# Patient Record
Sex: Female | Born: 1955 | Race: White | Hispanic: No | Marital: Married | Smoking: Current every day smoker
Health system: Southern US, Community
[De-identification: ages and names within clinical notes are randomized; demographics above are authoritative.]

## PROBLEM LIST (undated history)

## (undated) DIAGNOSIS — I10 Essential (primary) hypertension: Secondary | ICD-10-CM

## (undated) DIAGNOSIS — E663 Overweight: Secondary | ICD-10-CM

## (undated) DIAGNOSIS — I739 Peripheral vascular disease, unspecified: Secondary | ICD-10-CM

## (undated) DIAGNOSIS — I2510 Atherosclerotic heart disease of native coronary artery without angina pectoris: Secondary | ICD-10-CM

## (undated) DIAGNOSIS — I6529 Occlusion and stenosis of unspecified carotid artery: Secondary | ICD-10-CM

## (undated) DIAGNOSIS — I219 Acute myocardial infarction, unspecified: Secondary | ICD-10-CM

## (undated) DIAGNOSIS — E079 Disorder of thyroid, unspecified: Secondary | ICD-10-CM

## (undated) DIAGNOSIS — D649 Anemia, unspecified: Secondary | ICD-10-CM

## (undated) DIAGNOSIS — Z720 Tobacco use: Secondary | ICD-10-CM

## (undated) HISTORY — DX: Essential (primary) hypertension: I10

## (undated) HISTORY — DX: Tobacco use: Z72.0

## (undated) HISTORY — PX: APPENDECTOMY: SHX54

## (undated) HISTORY — PX: ABDOMINAL HYSTERECTOMY: SHX81

## (undated) HISTORY — DX: Occlusion and stenosis of unspecified carotid artery: I65.29

## (undated) HISTORY — PX: BACK SURGERY: SHX140

## (undated) HISTORY — PX: CORONARY ANGIOPLASTY WITH STENT PLACEMENT: SHX49

## (undated) HISTORY — DX: Overweight: E66.3

## (undated) HISTORY — DX: Anemia, unspecified: D64.9

## (undated) HISTORY — DX: Peripheral vascular disease, unspecified: I73.9

## (undated) HISTORY — PX: PLANTAR FASCIA SURGERY: SHX746

## (undated) HISTORY — DX: Disorder of thyroid, unspecified: E07.9

---

## 1999-01-16 ENCOUNTER — Encounter: Payer: Self-pay | Admitting: Family Medicine

## 1999-01-16 ENCOUNTER — Encounter: Admission: RE | Admit: 1999-01-16 | Discharge: 1999-01-16 | Payer: Self-pay | Admitting: Family Medicine

## 1999-01-23 ENCOUNTER — Encounter: Admission: RE | Admit: 1999-01-23 | Discharge: 1999-01-23 | Payer: Self-pay | Admitting: Family Medicine

## 1999-01-23 ENCOUNTER — Encounter: Payer: Self-pay | Admitting: Family Medicine

## 1999-03-25 ENCOUNTER — Ambulatory Visit: Admission: RE | Admit: 1999-03-25 | Discharge: 1999-03-25 | Payer: Self-pay | Admitting: Family Medicine

## 1999-11-19 ENCOUNTER — Other Ambulatory Visit: Admission: RE | Admit: 1999-11-19 | Discharge: 1999-11-19 | Payer: Self-pay | Admitting: Obstetrics and Gynecology

## 1999-11-20 ENCOUNTER — Other Ambulatory Visit: Admission: RE | Admit: 1999-11-20 | Discharge: 1999-11-20 | Payer: Self-pay | Admitting: Obstetrics and Gynecology

## 1999-11-20 ENCOUNTER — Encounter (INDEPENDENT_AMBULATORY_CARE_PROVIDER_SITE_OTHER): Payer: Self-pay

## 2000-01-25 ENCOUNTER — Encounter: Payer: Self-pay | Admitting: Family Medicine

## 2000-01-25 ENCOUNTER — Encounter: Admission: RE | Admit: 2000-01-25 | Discharge: 2000-01-25 | Payer: Self-pay | Admitting: Family Medicine

## 2000-11-18 ENCOUNTER — Other Ambulatory Visit: Admission: RE | Admit: 2000-11-18 | Discharge: 2000-11-18 | Payer: Self-pay | Admitting: Obstetrics and Gynecology

## 2002-01-17 ENCOUNTER — Other Ambulatory Visit: Admission: RE | Admit: 2002-01-17 | Discharge: 2002-01-17 | Payer: Self-pay | Admitting: Obstetrics and Gynecology

## 2002-02-13 ENCOUNTER — Encounter: Admission: RE | Admit: 2002-02-13 | Discharge: 2002-02-13 | Payer: Self-pay | Admitting: Obstetrics and Gynecology

## 2002-02-13 ENCOUNTER — Encounter: Payer: Self-pay | Admitting: Obstetrics and Gynecology

## 2002-02-20 ENCOUNTER — Encounter: Admission: RE | Admit: 2002-02-20 | Discharge: 2002-02-20 | Payer: Self-pay | Admitting: Internal Medicine

## 2002-02-20 ENCOUNTER — Encounter: Payer: Self-pay | Admitting: Obstetrics and Gynecology

## 2002-04-17 ENCOUNTER — Encounter (INDEPENDENT_AMBULATORY_CARE_PROVIDER_SITE_OTHER): Payer: Self-pay | Admitting: Specialist

## 2002-04-17 ENCOUNTER — Observation Stay (HOSPITAL_COMMUNITY): Admission: RE | Admit: 2002-04-17 | Discharge: 2002-04-18 | Payer: Self-pay | Admitting: Obstetrics and Gynecology

## 2003-07-29 ENCOUNTER — Encounter: Admission: RE | Admit: 2003-07-29 | Discharge: 2003-07-29 | Payer: Self-pay | Admitting: Family Medicine

## 2003-08-18 ENCOUNTER — Encounter: Admission: RE | Admit: 2003-08-18 | Discharge: 2003-08-18 | Payer: Self-pay | Admitting: Family Medicine

## 2003-09-12 ENCOUNTER — Inpatient Hospital Stay (HOSPITAL_COMMUNITY): Admission: RE | Admit: 2003-09-12 | Discharge: 2003-09-14 | Payer: Self-pay | Admitting: Neurosurgery

## 2004-04-06 ENCOUNTER — Encounter: Admission: RE | Admit: 2004-04-06 | Discharge: 2004-04-06 | Payer: Self-pay | Admitting: Family Medicine

## 2004-12-10 ENCOUNTER — Inpatient Hospital Stay (HOSPITAL_COMMUNITY): Admission: RE | Admit: 2004-12-10 | Discharge: 2004-12-13 | Payer: Self-pay | Admitting: Orthopaedic Surgery

## 2009-04-10 ENCOUNTER — Inpatient Hospital Stay (HOSPITAL_COMMUNITY): Admission: EM | Admit: 2009-04-10 | Discharge: 2009-04-12 | Payer: Self-pay | Admitting: Cardiology

## 2009-04-10 ENCOUNTER — Encounter: Payer: Self-pay | Admitting: Emergency Medicine

## 2009-04-10 ENCOUNTER — Ambulatory Visit: Payer: Self-pay | Admitting: Cardiology

## 2009-04-10 ENCOUNTER — Encounter: Payer: Self-pay | Admitting: Cardiology

## 2009-04-18 ENCOUNTER — Encounter: Payer: Self-pay | Admitting: Cardiology

## 2009-04-23 ENCOUNTER — Encounter: Payer: Self-pay | Admitting: Cardiology

## 2009-04-23 DIAGNOSIS — E039 Hypothyroidism, unspecified: Secondary | ICD-10-CM | POA: Insufficient documentation

## 2009-04-23 DIAGNOSIS — D649 Anemia, unspecified: Secondary | ICD-10-CM

## 2009-04-23 DIAGNOSIS — F17200 Nicotine dependence, unspecified, uncomplicated: Secondary | ICD-10-CM | POA: Insufficient documentation

## 2009-04-23 DIAGNOSIS — I10 Essential (primary) hypertension: Secondary | ICD-10-CM | POA: Insufficient documentation

## 2009-04-24 ENCOUNTER — Ambulatory Visit: Payer: Self-pay | Admitting: Cardiology

## 2009-04-24 ENCOUNTER — Encounter: Payer: Self-pay | Admitting: Physician Assistant

## 2009-04-24 DIAGNOSIS — I2510 Atherosclerotic heart disease of native coronary artery without angina pectoris: Secondary | ICD-10-CM | POA: Insufficient documentation

## 2009-04-24 DIAGNOSIS — R079 Chest pain, unspecified: Secondary | ICD-10-CM

## 2009-04-24 DIAGNOSIS — E876 Hypokalemia: Secondary | ICD-10-CM

## 2009-04-24 DIAGNOSIS — E785 Hyperlipidemia, unspecified: Secondary | ICD-10-CM | POA: Insufficient documentation

## 2009-04-24 LAB — CONVERTED CEMR LAB
BUN: 18 mg/dL (ref 6–23)
Sodium: 139 meq/L (ref 135–145)

## 2009-06-09 ENCOUNTER — Telehealth (INDEPENDENT_AMBULATORY_CARE_PROVIDER_SITE_OTHER): Payer: Self-pay | Admitting: *Deleted

## 2009-06-18 ENCOUNTER — Ambulatory Visit: Payer: Self-pay

## 2009-06-18 ENCOUNTER — Ambulatory Visit: Payer: Self-pay | Admitting: Cardiology

## 2009-06-18 DIAGNOSIS — I739 Peripheral vascular disease, unspecified: Secondary | ICD-10-CM

## 2009-06-18 DIAGNOSIS — R5383 Other fatigue: Secondary | ICD-10-CM

## 2009-06-18 DIAGNOSIS — R5381 Other malaise: Secondary | ICD-10-CM | POA: Insufficient documentation

## 2009-06-20 ENCOUNTER — Telehealth: Payer: Self-pay | Admitting: Cardiology

## 2009-06-20 ENCOUNTER — Encounter: Payer: Self-pay | Admitting: Cardiology

## 2009-06-20 LAB — CONVERTED CEMR LAB
AST: 25 units/L (ref 0–37)
Basophils Absolute: 0 10*3/uL (ref 0.0–0.1)
Basophils Relative: 0.3 % (ref 0.0–3.0)
Bilirubin, Direct: 0 mg/dL (ref 0.0–0.3)
CO2: 30 meq/L (ref 19–32)
Calcium: 9 mg/dL (ref 8.4–10.5)
Cholesterol: 118 mg/dL (ref 0–200)
Eosinophils Relative: 2 % (ref 0.0–5.0)
HDL: 53 mg/dL (ref >39.00)
Lymphs Abs: 2.2 10*3/uL (ref 0.7–4.0)
Monocytes Relative: 5.6 % (ref 3.0–12.0)
Neutro Abs: 5.1 10*3/uL (ref 1.4–7.7)
Neutrophils Relative %: 64.3 % (ref 43.0–77.0)
Platelets: 192 10*3/uL (ref 150.0–400.0)
Potassium: 4.8 meq/L (ref 3.5–5.1)
RBC: 4.46 M/uL (ref 3.87–5.11)
Total CHOL/HDL Ratio: 2
Total Protein: 7.6 g/dL (ref 6.0–8.3)

## 2009-07-01 ENCOUNTER — Ambulatory Visit: Payer: Self-pay | Admitting: Cardiovascular Disease

## 2009-07-02 ENCOUNTER — Ambulatory Visit (HOSPITAL_COMMUNITY): Admission: RE | Admit: 2009-07-02 | Discharge: 2009-07-02 | Payer: Self-pay | Admitting: Cardiovascular Disease

## 2009-07-02 ENCOUNTER — Ambulatory Visit: Payer: Self-pay | Admitting: Cardiovascular Disease

## 2009-07-02 LAB — CONVERTED CEMR LAB
BUN: 12 mg/dL (ref 6–23)
Basophils Relative: 0 % (ref 0–1)
CO2: 27 meq/L (ref 19–32)
Calcium: 9 mg/dL (ref 8.4–10.5)
Eosinophils Absolute: 0.2 10*3/uL (ref 0.0–0.7)
Eosinophils Relative: 3 % (ref 0–5)
Glucose, Bld: 87 mg/dL (ref 70–99)
HCT: 35.7 % — ABNORMAL LOW (ref 36.0–46.0)
INR: 1.08 (ref <1.50)
Lymphs Abs: 2.6 10*3/uL (ref 0.7–4.0)
MCV: 87.7 fL (ref 78.0–100.0)
Monocytes Absolute: 0.5 10*3/uL (ref 0.1–1.0)
Neutro Abs: 3.1 10*3/uL (ref 1.7–7.7)
Neutrophils Relative %: 48 % (ref 43–77)
Platelets: 160 10*3/uL (ref 150–400)
Prothrombin Time: 13.9 s (ref 11.6–15.2)
RDW: 14.4 % (ref 11.5–15.5)
Sodium: 136 meq/L (ref 135–145)
aPTT: 30 s (ref 24–37)

## 2009-07-14 ENCOUNTER — Encounter: Payer: Self-pay | Admitting: Cardiovascular Disease

## 2009-07-15 ENCOUNTER — Ambulatory Visit: Payer: Self-pay | Admitting: Cardiovascular Disease

## 2009-07-15 ENCOUNTER — Ambulatory Visit: Payer: Self-pay

## 2009-08-07 ENCOUNTER — Ambulatory Visit: Payer: Self-pay | Admitting: Cardiology

## 2009-11-27 ENCOUNTER — Encounter: Payer: Self-pay | Admitting: Cardiology

## 2009-12-02 ENCOUNTER — Ambulatory Visit: Payer: Self-pay | Admitting: Cardiology

## 2010-01-16 ENCOUNTER — Telehealth: Payer: Self-pay | Admitting: Cardiology

## 2010-03-10 ENCOUNTER — Telehealth: Payer: Self-pay | Admitting: Cardiology

## 2010-03-11 ENCOUNTER — Encounter (INDEPENDENT_AMBULATORY_CARE_PROVIDER_SITE_OTHER): Payer: Self-pay | Admitting: *Deleted

## 2010-04-14 NOTE — Progress Notes (Signed)
Summary: re taking pain med for back pain  Phone Note Call from Patient   Caller: Patient [phone] Reason for Call: Talk to Nurse Summary of Call: can pt take alleve, advil or predisone for back pain? her orthopedic wants an ok before having her take any of them due to her plavix Initial call taken by: Glynda Jaeger,  January 16, 2010 1:24 PM  Follow-up for Phone Call        I called and spoke with the pt. I made her aware we do not recommend aleve and advil due to the increased risk of bleeding. I will review with Dr. Juanda Chance if he feels prednisone would be appropriate for her. Follow-up by: Sherri Rad, RN, BSN,  January 16, 2010 3:00 PM  Additional Follow-up for Phone Call Additional follow up Details #1::        The above was reviewed with Dr. Juanda Chance. He feels like the pt could tolerate aleve. He would recommend this above the other medications. If the pt required prednisone, it would be ok, but there are more associated SE with prednisone. I have called and explained this to the pt. Additional Follow-up by: Sherri Rad, RN, BSN,  January 16, 2010 6:09 PM

## 2010-04-14 NOTE — Miscellaneous (Signed)
Summary: [facility] Order/Treatment Plan  [facility] Order/Treatment Plan   Imported By: Roderic Ovens 04/29/2009 12:10:12  _____________________________________________________________________  External Attachment:    Type:   Image     Comment:   External Document

## 2010-04-14 NOTE — Assessment & Plan Note (Signed)
Summary: npv   Visit Type:  Initial Consult Primary Provider:  Dr Drue Novel in [place]  CC:  right leg pain.  History of Present Illness: 55 yo WF with history of CAD s/p inferior MI with bare metal stent in the RCA, HTN, hyperlipidemia, hypothyroidism, ongoing tobacco abuse who is here today for evaluation of right leg pain. She describes pain in the right hip, buttock and thigh after walking 300 feet. This resolves with rest. She also has severe pain in her right calf and foot at night extending up to her knee. This is described as a severe stabbing  pain. It lasts for 15 minutes and resolves on its own.   Current Medications (verified): 1)  Aspirin 81 Mg Tbec (Aspirin) .... Take One Tablet By Mouth Daily 2)  Plavix 75 Mg Tabs (Clopidogrel Bisulfate) .... Take One Daily 3)  Nitrostat 0.4 Mg Subl (Nitroglycerin) .... Take As Needed 4)  Crestor 40 Mg Tabs (Rosuvastatin Calcium) .... Take One Daily 5)  Alprazolam 0.5 Mg Tabs (Alprazolam) .... Take One Two Times A Day 6)  Atenolol 50 Mg Tabs (Atenolol) .... Take One Two Times A Day 7)  Levothroid 75 Mcg Tabs (Levothyroxine Sodium) .... Take One Daily 8)  Lisinopril 20 Mg Tabs (Lisinopril) .... Take One Daily  Allergies: 1)  ! Codeine 2)  ! Prednisone  Past History:  Past Medical History:  1. Hypertension.   2. Unclear lipid status.   3. Family history of premature coronary artery disease.   4. Ongoing tobacco use.   5. Overweight.   6. Hypothyroidism.   7. History of anemia.   8. PVD 9. CAD with inferior STEMI      Past Surgical History: Reviewed history from 04/23/2009 and no changes required.  She is status post back surgery as well as surgery   for plantar fasciitis, hysterectomy, and appendectomy.      Family History: Reviewed history from 04/23/2009 and no changes required.  Her mother-deceased,  had a history of diabetes and hypertension, died of CHF.   Her father is deceased,  had a history of  coronary artery disease.  She also has one sister that had an MI in her 89s.   Social History: Reviewed history from 04/23/2009 and no changes required.   She lives in [place] with her husband. She is married. She has 3 children.  She has approximately 30-pack-year history of ongoing tobacco use.  She currently is smoking 6-7 cigarettes per day.  She denies alcohol or drug abuse.  She does not exercise.  She works at a [employer].   Review of Systems  The patient denies fatigue, malaise, fever, weight gain/loss, vision loss, decreased hearing, hoarseness, chest pain, palpitations, shortness of breath, prolonged cough, wheezing, sleep apnea, coughing up blood, abdominal pain, blood in stool, nausea, vomiting, diarrhea, heartburn, incontinence, blood in urine, muscle weakness, joint pain, leg swelling, rash, skin lesions, headache, fainting, dizziness, depression, anxiety, enlarged lymph nodes, easy bruising or bleeding, and environmental allergies.         See HPI  Vital Signs:  Patient profile:   55 year old female Height:      64 inches Weight:      162 pounds BMI:     27.91 Pulse rate:   60 / minute Pulse rhythm:   irregular BP sitting:   130 / 70  (left arm) Cuff size:   large  Vitals Entered By: Danielle Rankin, CMA (July 01, 2009 3:49 PM)  Physical  Exam  General:  General: Well developed, well nourished, NAD HEENT: OP clear, mucus membranes moist SKIN: warm, dry Neuro: No focal deficits Musculoskeletal: Muscle strength 5/5 all ext Psychiatric: Mood and affect normal Neck: No JVD, no carotid bruits, no thyromegaly, no lymphadenopathy. Lungs:Clear bilaterally, no wheezes, rhonci, crackles CV: RRR no murmurs, gallops rubs Abdomen: soft, NT, ND, BS present Extremities: No edema, pulses 1+ left DP/PT. Trace right DP/PT.     EKG  Procedure date:  07/01/2009  Findings:      NSR, rate 60 bpm. Nonspecific T wave changes.   Arterial Doppler  Procedure date:   06/18/2009  Findings:      Severe ostial stenosis right common iliac artery. ABI 0.80 on right. 1.2 on left. Right ATA and PTA monophasic. Left ATA and PTA triphasic.   Impression & Recommendations:  Problem # 1:  PVD (ICD-443.9) She has symptoms c/w obstructive disease in right iliac system. This is confirmed by dopplers. Will arrange distal aortogram with bilateral lower ext runoff tomorrow with possible PTA of right iliac. BMET, CBC and coags today. Risk and benefits explained.   Other Orders: EKG w/ Interpretation (93000) PV Procedure (PV Procedure) T-Basic Metabolic Panel (56213-08657) T-CBC w/Diff ([id number]) T-PTT (41324-40102) T-Protime, Auto ([id number])  Patient Instructions: 1)  Your physician recommends that you schedule a follow-up appointment in: 2 weeks 2)  Your physician recommends that you continue on your current medications as directed. Please refer to the Current Medication list given to you today. 3)  Your physician has requested that you have a peripheral vascular angiogram. This exam is performed at the hospital. During this exam IV contrast is used to look at arterial blood flow.  Please review the information sheet given for details.

## 2010-04-14 NOTE — Assessment & Plan Note (Signed)
Summary: 2wk f/u cath 07-02-09/sl   Visit Type:  2 wk f/u Primary Provider:  Dr Drue Novel in [place]  CC:  mild swelling legs .  History of Present Illness: 55 yo WF with history of PVD, CAD s/p inferior MI with bare metal stent in the RCA, HTN, hyperlipidemia, hypothyroidism, ongoing tobacco abuse who is here today for PV follow up. She was seen as a new patient 2 weeks ago and described pain in the right hip, buttock and thigh after walking 300 feet. This resolved with rest. She also had severe pain in her right calf and foot at night extending up to her knee. This is described as a severe stabbing  pain. Non-invasive studies showed severe ostial right common iliac artery stenosis. I arranged a distal aortogram with bilateral lower extremity runoff for the following day. Angiography demonstrated ostial RCIA stenosis. A stent was placed in the right common iliac artery. She has done well since the procedure. Her right leg pain has completely resolved. She notes mild lower ext edema, bilateral, resolves at night. She has cut back to 5 cigarettes per day.   Problems Prior to Update: 1)  Fatigue  (ICD-780.79) 2)  Pvd  (ICD-443.9) 3)  Pvd  (ICD-443.9) 4)  Hypokalemia  (ICD-276.8) 5)  Hyperlipidemia-mixed  (ICD-272.4) 6)  Cad, Native Vessel  (ICD-414.01) 7)  Chest Pain Unspecified  (ICD-786.50) 8)  Anemia  (ICD-285.9) 9)  Hypothyroidism  (ICD-244.9) 10)  Tobacco Abuse  (ICD-305.1) 11)  Hypertension  (ICD-401.9)  Current Medications (verified): 1)  Aspirin 81 Mg Tbec (Aspirin) .... Take One Tablet By Mouth Daily 2)  Plavix 75 Mg Tabs (Clopidogrel Bisulfate) .... Take One Daily 3)  Nitrostat 0.4 Mg Subl (Nitroglycerin) .... Take As Needed 4)  Crestor 40 Mg Tabs (Rosuvastatin Calcium) .... Take One Daily 5)  Alprazolam 0.5 Mg Tabs (Alprazolam) .... Take One Two Times A Day 6)  Atenolol 50 Mg Tabs (Atenolol) .... Take One Two Times A Day 7)  Levothroid 75 Mcg Tabs (Levothyroxine  Sodium) .... Take One Daily 8)  Lisinopril 20 Mg Tabs (Lisinopril) .... Take One Daily  Allergies: 1)  ! Codeine 2)  ! Prednisone  Past History:  Past Medical History:  1. Hypertension.   2. Unclear lipid status.   3. Family history of premature coronary artery disease.   4. Ongoing tobacco use.   5. Overweight.   6. Hypothyroidism.   7. History of anemia.   8. PVD-s/p stent ostium right common iliac artery 9. CAD with inferior STEMI      Social History: Reviewed history from 07/01/2009 and no changes required.   She lives in [place] with her husband. She is married. She has 3 children.  She has approximately 30-pack-year history of ongoing tobacco use.  She currently is smoking 5 cigarettes per day.  She denies alcohol or drug abuse.  She does not exercise.  She works at a [employer].   Review of Systems       The patient complains of leg swelling.  The patient denies fatigue, malaise, fever, weight gain/loss, vision loss, decreased hearing, hoarseness, chest pain, palpitations, shortness of breath, prolonged cough, wheezing, sleep apnea, coughing up blood, abdominal pain, blood in stool, nausea, vomiting, diarrhea, heartburn, incontinence, blood in urine, muscle weakness, joint pain, rash, skin lesions, headache, fainting, dizziness, depression, anxiety, enlarged lymph nodes, easy bruising or bleeding, and environmental allergies.    Vital Signs:  Patient profile:   55 year old female Height:  64 inches Weight:      164 pounds Pulse rate:   63 / minute BP sitting:   112 / 76  (left arm) Cuff size:   regular  Vitals Entered By: Oswald Hillock (Jul 15, 2009 5:02 PM)  Physical Exam  General:  General: Well developed, well nourished, NAD Musculoskeletal: Muscle strength 5/5 all ext Psychiatric: Mood and affect normal Neck: No JVD, no carotid bruits, no thyromegaly, no lymphadenopathy. Lungs:Clear bilaterally, no wheezes, rhonci, crackles CV: RRR no  murmurs, gallops rubs Abdomen: soft, NT, ND, BS present Extremities: No edema on exam today, pulses 1-2+ bilateral DP/PT    ABI's  Procedure date:  07/15/2009  Findings:      ABI 1.1 right and left.   Arteriogram-Lower Extremity  Procedure date:  07/02/2009  Findings:      ANGIOGRAPHIC FINDINGS: 1. The distal aorta has mild plaque disease. 2. The bilateral renal arteries are patent without significant     stenosis. 3. The right common iliac artery has a 95% stenosis at the ostium.     The right external iliac artery and right common femoral artery     have plaque disease.  The right superficial femoral artery and     popliteal artery are patent without any significant stenosis.     There is 3-vessel runoff to the right foot via the anterior tibial     artery to posterior tibial artery and peroneal artery. 4. The left common iliac artery, external iliac artery, internal iliac     artery and common femoral artery are patent without any disease.     The left superficial femoral artery and popliteal artery are patent     without any disease.  There is 3-vessel runoff to the left foot.   IMPRESSION: 1. 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 2. Successful percutaneous transluminal angioplasty with placement of     a stent in the ostium of the right common iliac artery.  Impression & Recommendations:  Problem # 1:  PVD (ICD-443.9) s/p stent ostium of right common iliac artery. Doing well. ABI normalized. Pain resolved. She is on ASA and Plavix post MI. Will continue. Repeat ABI in 6 months. I will see her back in one year.   Problem # 2:  TOBACCO ABUSE (ICD-305.1) Smoking cessation encouraged. She is trying to stop.   Patient Instructions: 1)  Your physician recommends that you schedule a follow-up appointment in: 1 year 2)  Your physician has requested that you have an ankle brachial index (ABI). During this test an ultrasound and blood pressure cuff are used to  evaluate the arteries that supply the arms and legs with blood. Allow thirty minutes for this exam. There are no restrictions or special instructions. To be done in 6 months

## 2010-04-14 NOTE — Miscellaneous (Signed)
Summary: Orders Update  Clinical Lists Changes  Orders: Added new Test order of Arterial Duplex Lower Extremity (Arterial Duplex Low) - Signed 

## 2010-04-14 NOTE — Assessment & Plan Note (Signed)
Summary: Lisa [name]   Primary Provider:  Dr Drue Novel in [place]  CC:  chest pain pt takes nitro which helps.  History of Present Illness: The patient is 55 years and then returned for management of CAD. In June 2007 she had an inferior MI and with a bare-metal stent to the RCA in April 2011 she had stenting of the right iliac artery by Dr. Gildardo Griffes.  She had a negative stress ECG 5/11  She had had no cp, sob, palp.  She still smokes..    Current Medications (verified): 1)  Aspirin 81 Mg Tbec (Aspirin) .... Take One Tablet By Mouth Daily 2)  Plavix 75 Mg Tabs (Clopidogrel Bisulfate) .... Take One Daily 3)  Nitrostat 0.4 Mg Subl (Nitroglycerin) .... Take As Needed 4)  Crestor 40 Mg Tabs (Rosuvastatin Calcium) .... Take One Daily 5)  Alprazolam 0.5 Mg Tabs (Alprazolam) .... Take One Two Times A Day 6)  Atenolol 50 Mg Tabs (Atenolol) .... Take One Two Times A Day 7)  Levothroid 75 Mcg Tabs (Levothyroxine Sodium) .... Take One Daily 8)  Lisinopril 20 Mg Tabs (Lisinopril) .... Take One Daily  Allergies: 1)  ! Codeine 2)  ! Prednisone  Past History:  Past Surgical History: Last updated: 04/23/2009  She is status post back surgery as well as surgery   for plantar fasciitis, hysterectomy, and appendectomy.      Family History: Last updated: Jul 18, 2009  Her mother-deceased,  had a history of diabetes and hypertension, died of CHF.   Her father is deceased,  had a history of coronary artery disease.  She also has one sister that had an MI in her 50s.   Social History: Last updated: 07/15/2009   She lives in [place] with her husband. She is married. She has 3 children.  She has approximately 30-pack-year history of ongoing tobacco use.  She currently is smoking 5 cigarettes per day.  She denies alcohol or drug abuse.  She does not exercise.  She works at a [employer].   Past Medical History: Reviewed history from 07/15/2009 and no changes required.  1. Hypertension.    2. Unclear lipid status.   3. Family history of premature coronary artery disease.   4. Ongoing tobacco use.   5. Overweight.   6. Hypothyroidism.   7. History of anemia.   8. PVD-s/p stent ostium right common iliac artery 9. CAD with inferior STEMI      Review of Systems       ROS is negative except as outlined in HPI.   Vital Signs:  Patient profile:   55 year old female Height:      64 inches Weight:      156 pounds BMI:     26.87 Pulse rate:   58 / minute Resp:     14 per minute BP sitting:   130 / 75  (left arm)  Vitals Entered By: Kem Parkinson (December 02, 2009 3:58 PM)  Physical Exam  Additional Exam:  Gen. Well-nourished, in no distress   Neck: No JVD, thyroid not enlarged, no carotid bruits Lungs: No tachypnea, clear without rales, rhonchi or wheezes Cardiovascular: Rhythm regular, PMI not displaced,  heart sounds  normal, no murmurs or gallops, no peripheral edema, pulses normal in all 4 extremities. Abdomen: BS normal, abdomen soft and non-tender without masses or organomegaly, no hepatosplenomegaly. MS: No deformities, no cyanosis or clubbing   Neuro:  No focal sns   Skin:  no lesions  Impression & Recommendations:  Problem # 1:  CAD, NATIVE VESSEL (ICD-414.01) She had DMI Rx BMS. No cp.  This is stable. Her updated medication list for this problem includes:    Aspirin 81 Mg Tbec (Aspirin) .Marland Kitchen... Take one tablet by mouth daily    Plavix 75 Mg Tabs (Clopidogrel bisulfate) .Marland Kitchen... Take one daily    Nitrostat 0.4 Mg Subl (Nitroglycerin) .Marland Kitchen... Take as needed    Atenolol 50 Mg Tabs (Atenolol) .Marland Kitchen... Take one two times a day    Lisinopril 20 Mg Tabs (Lisinopril) .Marland Kitchen... Take one daily  Orders: EKG w/ Interpretation (93000)  Problem # 2:  PVD (ICD-443.9) She had stent iliac R.  No claudication.  Stable.  Problem # 3:  TOBACCO ABUSE (ICD-305.1) She continues to smoke.  We counseled.  Patient Instructions: 1)  Your physician recommends that you  continue on your current medications as directed. Please refer to the Current Medication list given to you today. 2)  Your physician wants you to follow-up in: 6 months with Dr. Clifton James.  You will receive a reminder letter in the mail two months in advance. If you don't receive a letter, please call our office to schedule the follow-up appointment.

## 2010-04-14 NOTE — Assessment & Plan Note (Signed)
Summary: 2 MONTH ROV   Visit Type:  Follow-up Primary Provider:  Dr Drue Novel in [place]  CC:  pt had eposide of chest pain- pt took nitro pain went away.  History of Present Illness: Patient is 55 years old and return for management of CAD. 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 On April 10, 2009 she had an inferior MI treated with a bare-metal stent to the right coronary artery. Her ejection fraction was 65%. She has only fair since that time. She has had symptoms of fatigue and has not had any energy. She says she can't afford to be involved in the rehabilitation program. She also has had calf pain in the right calf the night before last which was sharp and lasted about a half an hour. She's had a similar episode about a week before her heart attack. She was concerned that this might be peripheral vascular disease.  Her other problems include hypertension and hyperlipidemia. She is also a cigarette smoker but has cut back from one half packs for 30 cigarettes a day to 5 cigarettes a day.  Allergies (verified): 1)  ! Codeine 2)  ! Prednisone  Past History:  Past Medical History: Reviewed history from 04/23/2009 and no changes required.  1. Hypertension.   2. Unclear lipid status.   3. Family history of premature coronary artery disease.   4. Ongoing tobacco use.   5. Overweight.   6. Hypothyroidism.   7. History of anemia.      Review of Systems       ROS is negative except as outlined in HPI.   Vital Signs:  Patient profile:   55 year old female Height:      64 inches Weight:      162 pounds BMI:     27.91 Pulse rate:   51 / minute BP sitting:   127 / 71  (left arm) Cuff size:   regular  Vitals Entered By: Burnett Kanaris, CNA (June 18, 2009 9:10 AM)  Physical Exam  Additional Exam:  Gen. Well-nourished, in no distress   Neck: No JVD, thyroid not enlarged, no carotid bruits Lungs: No tachypnea, clear without rales, rhonchi or  wheezes Cardiovascular: Rhythm regular, PMI not displaced,  heart sounds  normal, no murmurs or gallops, no peripheral edema, I had difficulty feeling pulses in either foot and her femoral pulses were decreased. Abdomen: BS normal, abdomen soft and non-tender without masses or organomegaly, no hepatosplenomegaly. MS: No deformities, no cyanosis or clubbing   Neuro:  No focal sns   Skin:  no lesions    Impression & Recommendations:  Problem # 1:  CAD, NATIVE VESSEL (ICD-414.01)  She had an inferior MI in January treated with a bare-metal stent to the right coronary. She has had some recurrent chest pain and she also has had continued fatigue. Her ejection fraction was 65% catheterization. Her ECG today shows T-wave inversion in V1 and V2 which is a little different than her previous ECG. Not sure regarding etiology of her chest pain and fatigue. We will get blood studies today to evaluate the fatigue and we'll arrange for her to have a stress ECG in about 6 weeks. She can't walk up a hill because of her back so we'll have her walk on flat protocol Her updated medication list for this problem includes:    Aspirin 81 Mg Tbec (Aspirin) .Marland Kitchen... Take one tablet by mouth daily    Plavix 75 Mg Tabs (Clopidogrel  bisulfate) .Marland Kitchen... Take one daily    Nitrostat 0.4 Mg Subl (Nitroglycerin) .Marland Kitchen... Take as needed    Atenolol 50 Mg Tabs (Atenolol) .Marland Kitchen... Take one two times a day    Lisinopril 20 Mg Tabs (Lisinopril) .Marland Kitchen... Take one daily  Her updated medication list for this problem includes:    Aspirin 81 Mg Tbec (Aspirin) .Marland Kitchen... Take one tablet by mouth daily    Plavix 75 Mg Tabs (Clopidogrel bisulfate) .Marland Kitchen... Take one daily    Nitrostat 0.4 Mg Subl (Nitroglycerin) .Marland Kitchen... Take as needed    Atenolol 50 Mg Tabs (Atenolol) .Marland Kitchen... Take one two times a day    Lisinopril 20 Mg Tabs (Lisinopril) .Marland Kitchen... Take one daily  Orders: EKG w/ Interpretation (93000) TLB-BMP (Basic Metabolic Panel-BMET) (80048-METABOL) TLB-CBC  Platelet - w/Differential (85025-CBCD) TLB-Hepatic/Liver Function Pnl (80076-HEPATIC) TLB-Lipid Panel (80061-LIPID) TLB-TSH (Thyroid Stimulating Hormone) (84443-TSH) Treadmill (Treadmill)  Problem # 2:  PVD (ICD-443.9) She has developed pain in her right calf which occurs at rest and doesn't sound like it's related to peripheral vascular disease. However her pulses are decreased in her feet and her femoral region I suspect she does have significant peripheral vascular disease. We will evaluate her with peripheral arterial Doppler studies. I suspect the pain in his leg is related to some type of neuropathy. She has no edema.  Problem # 3:  HYPERLIPIDEMIA-MIXED (ICD-272.4)  We will get a fasting lipid and liver profile today. Her updated medication list for this problem includes:    Crestor 40 Mg Tabs (Rosuvastatin calcium) .Marland Kitchen... Take one daily  Her updated medication list for this problem includes:    Crestor 40 Mg Tabs (Rosuvastatin calcium) .Marland Kitchen... Take one daily  Other Orders: Arterial Duplex Lower Extremity (Arterial Duplex Low)  Patient Instructions: 1)  Your physician recommends that you have FASTING lab work today: lipid/liver/cbc/bmet/tsh (414.01;272.2;402.10) 2)  Your physician has requested that you have a lower extremity arterial duplex.  This test is an ultrasound of the arteries in the legs or arms.  It looks at arterial blood flow in the legs and arms.  Allow one hour for Lower and Upper Arterial scans. There are no restrictions or special instructions. 3)  Your physician has requested that you have an exercise tolerance test in 6 weeks.  For further information please visit [email or web address]  Please also follow instruction sheet, as given. 4)  Your physician has recommended you make the following change in your medication: 1) Decrease aspirin to 81mg  once daily.

## 2010-04-14 NOTE — Progress Notes (Signed)
  Walk in Patient Form Recieved "Pt left papers " Credit Disability Insurance Claim" forwarded to [facility] for processing [facility]  June 09, 2009 4:14 PM    Appended Document:  Pt is here today for Appt w/ Juanda Chance she was checking up on her "[employer]" form, Renee from [employer] said the paperwork is Pending..Let pt/Lisa [name] know.Marland Kitchenkm  Appended Document:  Recieved [employer] papers back on 3 pm run I took around to [employer]

## 2010-04-14 NOTE — Assessment & Plan Note (Signed)
Summary: eph/jml   Visit Type:  Follow-up  CC:  chest pain.  History of Present Illness: This is a 55 year old white female patient, who was recently hospitalized with an acute ST elevation MI treated with bare-metal stent to the RCA. She has history of hypertension, hyperlipidemia, hypothyroidism, and tobacco abuse. 2-D echo in the hospital revealed an ejection fraction of 65% with minimal inferior hypokinesis with grade 1 diastolic dysfunction.  Patient comes in today complaining of leg cramps, and some sharp, shooting pains in her legs on occasion. She also has some tenderness in her left chest and occasional sharp, shooting pain. She denies any chest pressure, heaviness, or sensation, like she had when she had her MI. She denies dyspnea, and dyspnea on exertion, dizziness, or presyncope. She has decreased her smoking to one cigarette a day. She cannot afford cardiac rehabilitation. She is walking approximately 30 minutes daily. 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  Current Medications (verified): 1)  Ecotrin 325 Mg Tbec (Aspirin) .... Take One Daily 2)  Plavix 75 Mg Tabs (Clopidogrel Bisulfate) .... Take One Daily 3)  Nitrostat 0.4 Mg Subl (Nitroglycerin) .... Take As Needed 4)  Crestor 40 Mg Tabs (Rosuvastatin Calcium) .... Take One Daily 5)  Alprazolam 0.5 Mg Tabs (Alprazolam) .... Take One Two Times A Day 6)  Atenolol 50 Mg Tabs (Atenolol) .... Take One Two Times A Day 7)  Levothroid 75 Mcg Tabs (Levothyroxine Sodium) .... Take One Daily 8)  Lisinopril 20 Mg Tabs (Lisinopril) .... Take One Daily  Allergies (verified): 1)  ! Codeine 2)  ! Prednisone  Past History:  Past Medical History: Last updated: 04/23/2009  1. Hypertension.   2. Unclear lipid status.   3. Family history of premature coronary artery disease.   4. Ongoing tobacco use.   5. Overweight.   6. Hypothyroidism.   7. History of anemia.      Past Surgical History: Last updated: 04/23/2009  She is  status post back surgery as well as surgery   for plantar fasciitis, hysterectomy, and appendectomy.      Review of Systems       see history of present illness  Vital Signs:  Patient profile:   55 year old female Height:      64 inches Weight:      166 pounds BMI:     28.60 Pulse rate:   64 / minute Pulse rhythm:   regular BP sitting:   108 / 68  (left arm)  Vitals Entered By: Jacquelin Hawking, CMA (April 24, 2009 8:17 AM)  Physical Exam  General:   Well-nournished, in no acute distress. Neck: No JVD, HJR, Bruit, or thyroid enlargement Lungs: Decreased breath sounds throughout,No tachypnea, clear without wheezing, rales, or rhonchi Cardiovascular: RRR, PMI not displaced, heart sounds normal, no murmurs, gallops, bruit, thrill, or heave. Abdomen: BS normal. Soft without organomegaly, masses, lesions or tenderness. Extremities: Left groin without hematoma or hemorrhage,without cyanosis, clubbing or edema. Good distal pulses bilateral SKin: Warm, no lesions or rashes  Musculoskeletal: No deformities Neuro: no focal signs    EKG  Procedure date:  04/24/2009  Findings:      normal sinus rhythm with inferior Q waves and T-wave inversion anterolaterally  Impression & Recommendations:  Problem # 1:  CAD, NATIVE VESSEL (ICD-414.01) Patient does have heard an ST elevation MI treated with bare-metal stent to the RCA. Ejection fraction was 65% with minimal inferior hypokinesis. She had grade 1 diastolic dysfunction on 2-D  echo. Cardiac rehabilitation was recommended, but patient says she cannot afford it. She is exercising daily. No further chest pain.I spent 15 minutes filling out papers for her to return back to work. Her updated medication list for this problem includes:    Ecotrin 325 Mg Tbec (Aspirin) .Marland Kitchen... Take one daily    Plavix 75 Mg Tabs (Clopidogrel bisulfate) .Marland Kitchen... Take one daily    Nitrostat 0.4 Mg Subl (Nitroglycerin) .Marland Kitchen... Take as needed    Atenolol 50 Mg Tabs  (Atenolol) .Marland Kitchen... Take one two times a day    Lisinopril 20 Mg Tabs (Lisinopril) .Marland Kitchen... Take one daily  Problem # 2:  HYPERTENSION (ICD-401.9) Blood pressure is stable Her updated medication list for this problem includes:    Ecotrin 325 Mg Tbec (Aspirin) .Marland Kitchen... Take one daily    Atenolol 50 Mg Tabs (Atenolol) .Marland Kitchen... Take one two times a day    Lisinopril 20 Mg Tabs (Lisinopril) .Marland Kitchen... Take one daily  Problem # 3:  HYPERLIPIDEMIA-MIXED (ICD-272.4) Patient is worried her leg cramps are coming from the Crestor. She was hypokalemic in the hospital. We will check a potassium today and reassess. She will have LFTs and lipid panel and 6 weeks. Her updated medication list for this problem includes:    Crestor 40 Mg Tabs (Rosuvastatin calcium) .Marland Kitchen... Take one daily  Problem # 4:  TOBACCO ABUSE (ICD-305.1) smoking cessation was discussed and encouraged  Other Orders: EKG w/ Interpretation (93000) TLB-BMP (Basic Metabolic Panel-BMET) (80048-METABOL)  Patient Instructions: 1)  Your physician recommends that you schedule a follow-up appointment in: 2 months with Dr. Juanda Chance 2)  Your physician recommends that you return for a FASTING lipid profile: and LFT in 6 weeks. 272.0 3)  Your MD recomends to stop smoking.  4)  You may return to work February 27th 2011.

## 2010-04-14 NOTE — Miscellaneous (Signed)
Summary: [facility] Progress Note  [facility] Progress Note   Imported By: Roderic Ovens 06/12/2009 14:19:43  _____________________________________________________________________  External Attachment:    Type:   Image     Comment:   External Document

## 2010-04-14 NOTE — Letter (Signed)
Summary: Peripheral Vascular  [facility], Main Office  [address]   [place], [place] [address]   Phone: [phone]  Fax: [phone]     07/01/2009 MRN: [id number]  Lisa [name] [address] [place], [place]  [address]  Dear Ms. Lisa [name],   You are scheduled for Peripheral Vascular Angiogram on   July 02, 2009            with Dr. Clifton James           .  Please arrive at the [facility] at  9:00     a.m.Marland Kitchen on the day of your procedure.  1. DIET     __X__ Nothing to eat or drink after midnight except your medications with a sip of water.  2. Come to the [facility] office on  (done today)           for lab work.  The lab at [facility] is open from 8:30 a.m. to 1:30 p.m. and 2:30 p.m. to 5:00 p.m.  The lab at [facility] is open from 7:30 a.m. to 5:30 p.m.  You do not have to be fasting.  3. MAKE SURE YOU TAKE YOUR ASPIRIN.  4. _____ DO NOT TAKE these medications before your procedure:         ________________________________________________________________________________      ___X_ Lisa [name] MAY TAKE ALL  your  medications with a small amount of water.      ____ START NEW medications:     ________________________________________________________________________________      ____ Lisa [name] instructions:     ________________________________________________________________________________  5. Plan for one night stay - bring personal belongings (i.e. toothpaste, toothbrush, etc.)  6. Bring a current list of your medications and current insurance cards.  7. Must have a responsible person to drive you home.   8. Someone must be with yu for the first 24 hours after you arrive home.  9. Please wear clothes that are easy to get on and off and wear slip-on shoes.  *Special note: Every effort is made to have your procedure done on time.  Occasionally there are emergencies that present themselves at the hospital that may  cause delays.  Please be patient if a delay does occur.  If you have any questions after you get home, please call the office at the number listed above.   Dossie Arbour, RN, BSN

## 2010-04-14 NOTE — Miscellaneous (Signed)
Summary: Orders Update  Clinical Lists Changes  Orders: Added new Referral order of Misc. Referral (Misc. Ref) - Signed 

## 2010-04-14 NOTE — Miscellaneous (Signed)
  Clinical Lists Changes  Observations: Added new observation of RESULTS MISC:  LOWER ARTERIAL EXAMINATION  The bilaterals ABI are in the normal range    (07/15/2009 15:44)      MISC. Report  Procedure date:  07/15/2009  Findings:       LOWER ARTERIAL EXAMINATION  The bilaterals ABI are in the normal range

## 2010-04-14 NOTE — Progress Notes (Signed)
Summary: calling back  Phone Note Call from Patient Call back at Home Phone [phone] Call back at [phone]    Caller: Patient Reason for Call: Talk to Nurse Details for Reason: calling back  Initial call taken by: Lorne Skeens,  June 20, 2009 3:05 PM  Follow-up for Phone Call        I spoke with the pt. Follow-up by: Sherri Rad, RN, BSN,  June 20, 2009 4:03 PM

## 2010-04-16 NOTE — Letter (Signed)
Summary: Generic Letter  [facility], Main Office  [address]   [place], [place] [address]   Phone: [phone]  Fax: [phone]        March 11, 2010 MRN: [id number]    Lisa [name] [address] [place], [place]  [address]    Dear Dr. Retia Passe,  [name] may hold her plavix for 7 days prior to spinal injection. If you have any questions, please contact our office at [phone].         Sincerely,  Charlies Constable, MD Sherri Rad, RN, BSN  This letter has been electronically signed by your physician.  Appended Document: Generic Letter Faxed to Attn : Westly Pam at the [facility] @ [phone].

## 2010-04-16 NOTE — Progress Notes (Signed)
Summary: need to stop Plavix dueb to a procedure   Phone Note Call from Patient Call back at Home Phone [phone]   Caller: Patient Summary of Call: Pt need to stop Plavix for seven days due a procedure  Initial call taken by: Judie Grieve,  March 10, 2010 4:08 PM  Follow-up for Phone Call        [facility]. Sherri Rad, RN, BSN  March 10, 2010 5:38 PM   I reviewed this with Dr. Juanda Chance, the pt should be ok to hold plavix 7 days prior to her procedure. Sherri Rad, RN, BSN  March 10, 2010 6:30 PM   I spoke with the pt this morning. She states she is needing to hold plavix for 7 prior to a spinal injection. I explained Dr. Juanda Chance states she should be ok to hold her plavix. The pt states we need to fax an ok for her to hold plavix to [phone] attn: Westly Pam at the [facility]. The pt is seeing Dr. Retia Passe. I will fax this. Follow-up by: Sherri Rad, RN, BSN,  March 11, 2010 10:20 AM

## 2010-05-26 ENCOUNTER — Ambulatory Visit (HOSPITAL_COMMUNITY)
Admission: RE | Admit: 2010-05-26 | Discharge: 2010-05-26 | Disposition: A | Discharge status: Home / Self Care | Payer: PRIVATE HEALTH INSURANCE | Source: Ambulatory Visit | Attending: Family Medicine | Admitting: Family Medicine

## 2010-05-26 DIAGNOSIS — I6529 Occlusion and stenosis of unspecified carotid artery: Secondary | ICD-10-CM | POA: Insufficient documentation

## 2010-05-26 DIAGNOSIS — I10 Essential (primary) hypertension: Secondary | ICD-10-CM | POA: Insufficient documentation

## 2010-05-26 DIAGNOSIS — I658 Occlusion and stenosis of other precerebral arteries: Secondary | ICD-10-CM | POA: Insufficient documentation

## 2010-05-26 DIAGNOSIS — E7800 Pure hypercholesterolemia, unspecified: Secondary | ICD-10-CM | POA: Insufficient documentation

## 2010-05-26 DIAGNOSIS — R42 Dizziness and giddiness: Secondary | ICD-10-CM

## 2010-05-29 ENCOUNTER — Ambulatory Visit (INDEPENDENT_AMBULATORY_CARE_PROVIDER_SITE_OTHER): Payer: PRIVATE HEALTH INSURANCE | Admitting: Cardiovascular Disease

## 2010-05-29 ENCOUNTER — Encounter: Payer: Self-pay | Admitting: Cardiovascular Disease

## 2010-05-29 DIAGNOSIS — R002 Palpitations: Secondary | ICD-10-CM

## 2010-05-29 DIAGNOSIS — I739 Peripheral vascular disease, unspecified: Secondary | ICD-10-CM

## 2010-05-29 DIAGNOSIS — I2510 Atherosclerotic heart disease of native coronary artery without angina pectoris: Secondary | ICD-10-CM

## 2010-05-31 LAB — DIFFERENTIAL
Basophils Absolute: 0 10*3/uL (ref 0.0–0.1)
Basophils Relative: 0 % (ref 0–1)
Eosinophils Absolute: 0.1 10*3/uL (ref 0.0–0.7)
Eosinophils Relative: 1 % (ref 0–5)
Lymphocytes Relative: 25 % (ref 12–46)
Lymphs Abs: 2.3 10*3/uL (ref 0.7–4.0)
Monocytes Absolute: 0.4 10*3/uL (ref 0.1–1.0)
Monocytes Relative: 4 % (ref 3–12)
Neutro Abs: 6.3 10*3/uL (ref 1.7–7.7)
Neutrophils Relative %: 70 % (ref 43–77)

## 2010-05-31 LAB — COMPREHENSIVE METABOLIC PANEL
ALT: 25 U/L (ref 0–35)
BUN: 16 mg/dL (ref 6–23)
Calcium: 8 mg/dL — ABNORMAL LOW (ref 8.4–10.5)
Chloride: 106 mEq/L (ref 96–112)
GFR calc non Af Amer: >60 mL/min (ref >60)
Glucose, Bld: 98 mg/dL (ref 70–99)
Total Bilirubin: 0.2 mg/dL — ABNORMAL LOW (ref 0.3–1.2)
Total Protein: 6.1 g/dL (ref 6.0–8.3)

## 2010-05-31 LAB — CBC
HCT: 31.7 % — ABNORMAL LOW (ref 36.0–46.0)
Hemoglobin: 10.9 g/dL — ABNORMAL LOW (ref 12.0–15.0)
MCHC: 34.4 g/dL (ref 30.0–36.0)
MCV: 89.1 fL (ref 78.0–100.0)
MCV: 89.2 fL (ref 78.0–100.0)
Platelets: 182 10*3/uL (ref 150–400)
Platelets: 198 10*3/uL (ref 150–400)
Platelets: 198 10*3/uL (ref 150–400)
RBC: 3.56 MIL/uL — ABNORMAL LOW (ref 3.87–5.11)
RDW: 14.2 % (ref 11.5–15.5)
RDW: 14.5 % (ref 11.5–15.5)
WBC: 7.8 10*3/uL (ref 4.0–10.5)
WBC: 9.1 10*3/uL (ref 4.0–10.5)

## 2010-05-31 LAB — BASIC METABOLIC PANEL
BUN: 12 mg/dL (ref 6–23)
Calcium: 8.2 mg/dL — ABNORMAL LOW (ref 8.4–10.5)
Chloride: 106 mEq/L (ref 96–112)
Creatinine, Ser: 0.76 mg/dL (ref 0.4–1.2)
GFR calc Af Amer: >60 mL/min (ref >60)
GFR calc non Af Amer: >60 mL/min (ref >60)
Glucose, Bld: 102 mg/dL — ABNORMAL HIGH (ref 70–99)
Potassium: 3.6 mEq/L (ref 3.5–5.1)
Sodium: 137 mEq/L (ref 135–145)

## 2010-05-31 LAB — COMPREHENSIVE METABOLIC PANEL WITH GFR
ALT: 23 U/L (ref 0–35)
AST: 19 U/L (ref 0–37)
Albumin: 3.1 g/dL — ABNORMAL LOW (ref 3.5–5.2)
Alkaline Phosphatase: 64 U/L (ref 39–117)
BUN: 19 mg/dL (ref 6–23)
CO2: 21 meq/L (ref 19–32)
Calcium: 7.6 mg/dL — ABNORMAL LOW (ref 8.4–10.5)
Chloride: 110 meq/L (ref 96–112)
Creatinine, Ser: 0.79 mg/dL (ref 0.4–1.2)
GFR calc non Af Amer: >60 mL/min
Glucose, Bld: 132 mg/dL — ABNORMAL HIGH (ref 70–99)
Potassium: 3.4 meq/L — ABNORMAL LOW (ref 3.5–5.1)
Sodium: 138 meq/L (ref 135–145)
Total Bilirubin: 0.3 mg/dL (ref 0.3–1.2)
Total Protein: 5.6 g/dL — ABNORMAL LOW (ref 6.0–8.3)

## 2010-05-31 LAB — LIPID PANEL
Cholesterol: 165 mg/dL (ref 0–200)
HDL: 39 mg/dL — ABNORMAL LOW (ref >39)
LDL Cholesterol: 86 mg/dL (ref 0–99)

## 2010-05-31 LAB — CARDIAC PANEL(CRET KIN+CKTOT+MB+TROPI)
CK, MB: 1.5 ng/mL (ref 0.3–4.0)
CK, MB: 24.5 ng/mL (ref 0.3–4.0)
CK, MB: 31.7 ng/mL (ref 0.3–4.0)
CK, MB: 37.2 ng/mL (ref 0.3–4.0)
Relative Index: 7.6 — ABNORMAL HIGH (ref 0.0–2.5)
Relative Index: 9.8 — ABNORMAL HIGH (ref 0.0–2.5)
Total CK: 322 U/L — ABNORMAL HIGH (ref 7–177)
Total CK: 362 U/L — ABNORMAL HIGH (ref 7–177)
Troponin I: 3.57 ng/mL (ref 0.00–0.06)
Troponin I: 3.94 ng/mL (ref 0.00–0.06)
Troponin I: 6.79 ng/mL (ref 0.00–0.06)

## 2010-05-31 LAB — HEMOGLOBIN A1C
Hgb A1c MFr Bld: 5.8 % (ref 4.6–6.1)
Mean Plasma Glucose: 120 mg/dL

## 2010-05-31 LAB — APTT: aPTT: 32 seconds (ref 24–37)

## 2010-05-31 LAB — PROTIME-INR
INR: 1.09 (ref 0.00–1.49)
INR: 9.66 (ref 0.00–1.49)
Prothrombin Time: 77.1 seconds — ABNORMAL HIGH (ref 11.6–15.2)

## 2010-05-31 LAB — T4: T4, Total: 8.2 ug/dL (ref 5.0–12.5)

## 2010-05-31 LAB — MAGNESIUM: Magnesium: 2.1 mg/dL (ref 1.5–2.5)

## 2010-06-01 LAB — POCT I-STAT, CHEM 8
Calcium, Ion: 1.11 mmol/L — ABNORMAL LOW (ref 1.12–1.32)
Creatinine, Ser: 0.8 mg/dL (ref 0.4–1.2)
Hemoglobin: 13.6 g/dL (ref 12.0–15.0)
Sodium: 138 mEq/L (ref 135–145)
TCO2: 24 mmol/L (ref 0–100)

## 2010-06-01 LAB — DIFFERENTIAL
Eosinophils Relative: 1 % (ref 0–5)
Lymphocytes Relative: 21 % (ref 12–46)
Lymphs Abs: 2.9 10*3/uL (ref 0.7–4.0)
Monocytes Absolute: 0.7 10*3/uL (ref 0.1–1.0)

## 2010-06-01 LAB — BASIC METABOLIC PANEL
GFR calc Af Amer: >60 mL/min (ref >60)
GFR calc non Af Amer: >60 mL/min (ref >60)
Potassium: 4.1 mEq/L (ref 3.5–5.1)
Sodium: 138 mEq/L (ref 135–145)

## 2010-06-01 LAB — PROTIME-INR: Prothrombin Time: 13.3 seconds (ref 11.6–15.2)

## 2010-06-01 LAB — CBC
HCT: 37.8 % (ref 36.0–46.0)
Hemoglobin: 12.8 g/dL (ref 12.0–15.0)
Platelets: 308 10*3/uL (ref 150–400)
WBC: 13.8 10*3/uL — ABNORMAL HIGH (ref 4.0–10.5)

## 2010-06-01 LAB — CK TOTAL AND CKMB (NOT AT ARMC)
Relative Index: INVALID (ref 0.0–2.5)
Total CK: 77 U/L (ref 7–177)

## 2010-06-02 NOTE — Assessment & Plan Note (Signed)
Summary: FOLLOW UP - 6 MONTHS/per pt call=mj   Visit Type:  Follow-up Primary Provider:  Dr Drue Novel in [place]  CC:  chest pressure, sharp pains in her head, dizziness, and salty tast in her mouth will not go away.  History of Present Illness: 55 yo WF with history of CAD, tobacco abuse, PAD, HTN, hyperlipidemia, hypothyroidism  here today for follow up.  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 In April 2011 she had stenting of the right common  iliac artery.  She had a negative stress ECG 5/11. Her cardiac issues had been followed by Dr. Juanda Chance.   She is here today for follow up. She tells me that she is having palpitations and awareness of irregularity of her heart rhythm. There is associated pressure in the chest with the skipped beats. No exertional chest pain or pressure. No SOB. She also has headaches with sharp stabbing pains in her head and salty taste in her mouth. Her legs feel great. No pain with walking.     Current Medications (verified): 1)  Aspirin 81 Mg Tbec (Aspirin) .... Take One Tablet By Mouth Daily 2)  Plavix 75 Mg Tabs (Clopidogrel Bisulfate) .... Take One Daily 3)  Nitrostat 0.4 Mg Subl (Nitroglycerin) .... Take As Needed 4)  Crestor 40 Mg Tabs (Rosuvastatin Calcium) .... Take One Daily 5)  Alprazolam 0.5 Mg Tabs (Alprazolam) .... Take One Two Times A Day 6)  Atenolol 50 Mg Tabs (Atenolol) .... Take One Two Times A Day 7)  Levothroid 75 Mcg Tabs (Levothyroxine Sodium) .... Take One Daily 8)  Lisinopril 20 Mg Tabs (Lisinopril) .... Take One Daily 9)  Ambien 5 Mg Tabs (Zolpidem Tartrate) .... At Bedtime  Allergies (verified): 1)  ! Codeine 2)  ! Prednisone  Past History:  Past Medical History: Reviewed history from 07/15/2009 and no changes required.  1. Hypertension.   2. Unclear lipid status.   3. Family history of premature coronary artery disease.   4. Ongoing tobacco use.   5. Overweight.   6.  Hypothyroidism.   7. History of anemia.   8. PVD-s/p stent ostium right common iliac artery 9. CAD with inferior STEMI      Social History: Reviewed history from 07/15/2009 and no changes required.   She lives in [place] with her husband. She is married. She has 3 children.  She has approximately 30-pack-year history of ongoing tobacco use.  She currently is smoking 5 cigarettes per day.  She denies alcohol or drug abuse.  She does not exercise.  She works at a [employer].   Review of Systems       The patient complains of chest pain, palpitations, and headache.  The patient denies fatigue, malaise, fever, weight gain/loss, vision loss, decreased hearing, hoarseness, shortness of breath, prolonged cough, wheezing, sleep apnea, coughing up blood, abdominal pain, blood in stool, nausea, vomiting, diarrhea, heartburn, incontinence, blood in urine, muscle weakness, joint pain, leg swelling, rash, skin lesions, fainting, dizziness, depression, anxiety, enlarged lymph nodes, easy bruising or bleeding, and environmental allergies.    Vital Signs:  Patient profile:   55 year old female Height:      64 inches Weight:      148.50 pounds BMI:     25.58 Pulse rate:   79 / minute BP sitting:   128 / 79  (left arm) Cuff size:   regular  Vitals Entered By: Caralee Ates CMA (May 29, 2010 3:13 PM)  Physical Exam  General:  General: Well developed, well nourished, NAD Musculoskeletal: Muscle strength 5/5 all ext Psychiatric: Mood and affect normal Neck: No JVD, no carotid bruits, no thyromegaly, no lymphadenopathy. Lungs:Clear bilaterally, no wheezes, rhonci, crackles CV: RRR no murmurs, gallops rubs Abdomen: soft, NT, ND, BS present Extremities: No edema, pulses 2+ DP/PT.     EKG  Procedure date:  05/29/2010  Findings:      NSR, non-specific T wave abnormalities.   Impression & Recommendations:  Problem # 1:  CAD, NATIVE VESSEL (ICD-414.01) Stable. No changes.   Her  updated medication list for this problem includes:    Aspirin 81 Mg Tbec (Aspirin) .Marland Kitchen... Take one tablet by mouth daily    Plavix 75 Mg Tabs (Clopidogrel bisulfate) .Marland Kitchen... Take one daily    Nitrostat 0.4 Mg Subl (Nitroglycerin) .Marland Kitchen... Take as needed    Atenolol 50 Mg Tabs (Atenolol) .Marland Kitchen... Take one two times a day    Lisinopril 20 Mg Tabs (Lisinopril) .Marland Kitchen... Take one daily  Problem # 2:  PALPITATIONS (ICD-785.1) Likely PVCs. I have asked her to wear a Holter monitor but she does not wish to do so at this time. She will call back if she continues to have palpitations. If she calls back with continued palpitations, we will set her up with a 48 hour Holter monitor.   Her updated medication list for this problem includes:    Aspirin 81 Mg Tbec (Aspirin) .Marland Kitchen... Take one tablet by mouth daily    Plavix 75 Mg Tabs (Clopidogrel bisulfate) .Marland Kitchen... Take one daily    Nitrostat 0.4 Mg Subl (Nitroglycerin) .Marland Kitchen... Take as needed    Atenolol 50 Mg Tabs (Atenolol) .Marland Kitchen... Take one two times a day    Lisinopril 20 Mg Tabs (Lisinopril) .Marland Kitchen... Take one daily  Problem # 3:  PVD (ICD-443.9) Stable. Repeat ABI in 6 months.   Patient Instructions: 1)  Your physician recommends that you schedule a follow-up appointment in: 6 months.  2)  ABI 6 months.

## 2010-07-28 NOTE — Procedures (Signed)
Lisa [name], Lisa [name] NO.:  [id number]   MEDICAL RECORD NO.:  [id number]          PATIENT TYPE:  OIB   LOCATION:  2899                         FACILITY:  [facility]   PHYSICIAN:  Verne Carrow, MDDATE OF BIRTH:  07-Feb-1956   DATE OF PROCEDURE:  07/02/2009  DATE OF DISCHARGE:                    PERIPHERAL VASCULAR INVASIVE PROCEDURE   PRIMARY CARDIOLOGIST:  Everardo Beals. Juanda Chance, M.D.   PRIMARY CARE PHYSICIAN:  Dr. Arlyce Dice   PROCEDURE PERFORMED:  1. Distal aortogram with bilateral lower extremity runoff.  2. Percutaneous transluminal angioplasty with placement of a stent in      the right common iliac artery.   INDICATIONS FOR PROCEDURE:  This is a 55 year old patient who has been  experiencing right lower extremity claudication and was recently found  with noninvasive studies to have a severe stenosis in the ostium of the  right common iliac artery.   DESCRIPTION OF PROCEDURE:  The patient was brought to the Peripheral  Vascular Laboratory after signing informed consent.  The left groin was  prepped and draped in a sterile fashion.  Lidocaine 1% was used for  local anesthesia.  A 5-French sheath was inserted into the left femoral  artery without difficulty.  I initially passed a pigtail catheter into  the distal aorta over a wire.  We initially performed angiography of the  distal aorta and visualized both renal arteries.  I then pulled the  pigtail catheter down to the level of the aortic bifurcation and  performed angulated shots of the pelvic vessels.  We then performed a  runoff of both legs to the feet.  At this point we elected to proceed to  intervention of the severe stenosis in the ostium of the right common  iliac artery.   Lidocaine 1% was injected into the right groin.  A 6-French sheath was  inserted into the right femoral artery without difficulty.  At this  point in the case, we easily passed a Versicor wire into the distal  aorta.  This was  advanced through the sheath into the right femoral  artery.  We then placed a pigtail catheter through the left femoral  artery sheath and performed angiography.  An 8.0 x 18 mm Cordis Genesis  stent was then carefully positioned at the ostium of the right common  iliac artery.  The stent was deployed with an excellent angiographic  result.  The stenosis was taken from 95% down to 0%.  The patient  tolerated the procedure well.  There was excellent flow into both the  right and left common iliac arteries at the conclusion of the case.  The  patient as taken to the holding area in stable condition.   Of note, the patient has been maintained on aspirin and Plavix since her  coronary stenting.   HEMODYNAMIC FINDINGS:  Central aortic pressure 136/59.   ANGIOGRAPHIC FINDINGS:  1. The distal aorta has mild plaque disease.  2. The bilateral renal arteries are patent without significant      stenosis.  3. The right common iliac artery has a 95% stenosis at the ostium.      The right  external iliac artery and right common femoral artery      have plaque disease.  The right superficial femoral artery and      popliteal artery are patent without any significant stenosis.      There is 3-vessel runoff to the right foot via the anterior tibial      artery to posterior tibial artery and peroneal artery.  4. The left common iliac artery, external iliac artery, internal iliac      artery and common femoral artery are patent without any disease.      The left superficial femoral artery and popliteal artery are patent      without any disease.  There is 3-vessel runoff to the left foot.   IMPRESSION:  1. 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  2. Successful percutaneous transluminal angioplasty with placement of      a stent in the ostium of the right common iliac artery.   RECOMMENDATIONS:  The patient will be continued on aspirin and Plavix.  We will monitor her closely with 4 hours of  bed rest today.  If she does  well after her bed rest, we will discharge her home tonight.      Verne Carrow, MD     CM/MEDQ  D:  07/02/2009  T:  07/02/2009  Job:  [id number]   cc:   Everardo Beals. Juanda Chance, MD, [facility]   Electronically Signed by Verne Carrow MD on 07/02/2009 03:53:18 PM

## 2010-07-31 NOTE — Op Note (Signed)
NAME:  [name], [name]                        ACCOUNT NO.:  [id number]   MEDICAL RECORD NO.:  [id number]                   PATIENT TYPE:  INP   LOCATION:  3008                                 FACILITY:  [facility]   PHYSICIAN:  Coletta Memos, M.D.                  DATE OF BIRTH:  October 16, 1955   DATE OF PROCEDURE:  09/12/2003  DATE OF DISCHARGE:                                 OPERATIVE REPORT   PREOPERATIVE DIAGNOSIS:  Right L5 radiculopathy and right L5 foraminal  stenosis.   POSTOPERATIVE DIAGNOSIS:  L5 spondylosis, L5 radiculopathy and L5 foraminal  stenosis.   COMPLICATIONS:  None.   PROCEDURE:  Right L5 semi-hemilaminectomy and foraminotomy with  microdissection.   SURGEON:  Coletta Memos, M.D.   ASSISTANT:  Cristi Loron, M.D.   COMPLICATIONS:  None.   INDICATIONS:  Lisa [name] presented with severe pain in her right lower  extremity.  MRI strongly suggested foraminal compression at the L5-S1 disk  space of the L5 nerve root on the right side.  I therefore recommended and  she agreed to undergo operative decompression.   DESCRIPTION OF PROCEDURE:  I took her to the operating room.  Her back was  prepped and she was intubated and placed under general anesthetic without  difficulty.  She was rolled prone onto the Wilson frame and all pressure  points were properly padded.  Her skin was prepped and she was draped in a  sterile fashion.  I infiltrated 0.5% lidocaine and 1:200,000-strength  epinephrine into the lumbar region.  I opened the skin with a #10 blade and  took this down to the thoracolumbar fascia.  I exposed the right-sided  lamina of L5.  I took an x-ray and then confirmed I was in the correct  interlaminar space.  I then was able to identify the transverse process of  L5 and then I performed a semi-hemilaminectomy at L5.  I then exposed the L5  nerve root and with Dr. Lovell Sheehan' assistance, performed a foraminotomy using  both a high-speed air drill and  Kerrison punches on the right side.  I did  this both from the inside-out and very marginally from the outside-in using  the transverse process.  I was able to then use probes to see that what I  feel was good decompression of the nerve root, or at least as good as I was  able to achieve without destabilizing the facet.  The facet joint and  capsule were left intact.  I then irrigated the wound.  I then closed the  wound in layered fashion using Vicryl sutures, reapproximating the  thoracolumbar fascia and subcutaneous tissues and skin edges.  Dermabond was  used for a sterile dressing.  The patient tolerated the procedure well,  moving all extremities postoperatively.  Coletta Memos, M.D.    KC/MEDQ  D:  09/12/2003  T:  09/13/2003  Job:  [id number]

## 2010-07-31 NOTE — Op Note (Signed)
Lisa [name], DANIEL NO.:  [id number]   MEDICAL RECORD NO.:  [id number]          PATIENT TYPE:  INP   LOCATION:  2872                         FACILITY:  [facility]   PHYSICIAN:  Sharolyn Douglas, M.D.        DATE OF BIRTH:  1955-09-09   DATE OF PROCEDURE:  12/10/2004  DATE OF DISCHARGE:                                 OPERATIVE REPORT   DIAGNOSIS:  1.  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  2.  History of L5-S1 laminotomy and diskectomy.  3.  Degenerative disk disease.   PROCEDURE:  1.  Revision L5-S1 hemilaminotomy with decompression of the left L5 and S1      nerve roots.  2.  Transforaminal lumbar interbody fusion of L5-S1 with placement of peak      cage packed with BMP.  3.  Pedicle screw instrumentation L5-S1 using Abbott spine system.  4.  Posterior spinal arthrodesis L5-S1.  5.  Local autogenous bone graft supplemented with bone morphogenic protein.   SURGEON:  Sharolyn Douglas, M.D.   ASSISTANT:  Verlin Fester, P.A.   ANESTHESIA:  General orotracheal   ESTIMATED BLOOD LOSS:  100 mL   COMPLICATIONS:  None.   INDICATIONS:  The patient is a pleasant 55 year old female who has chronic  disabling back and left lower extremity pain. She has had a previous  laminotomy on the left side at L5-S1 by another surgeon. She did well for a  short period of time when unfortunately she developed progressive foraminal  narrowing and disabling pain. She has been refractory to conservative care  and she had elected to undergo L5-S1 decompression and fusion in the hopes  of improving her symptoms. Risk and benefits were reviewed.   DESCRIPTION OF PROCEDURE:  The patient was identified in the holding, taken  to the operating room, under general orotracheal anesthesia without  difficulty and given prophylactic IV antibiotics. She was given prophylactic  IV antibiotics, turned prone onto the Wilson frame, and all bony prominences  were padded __________  at all  times. Neuro monitoring was established in  the form of SSEPs and lower extremity EMGs. The back was prepped and draped  in the usual sterile fashion. The previous midline incision was utilized and  extended several centimeters in each direction.   Dissection was carried through the previous dense scar tissue. On the left  side there was a scar extending all the way up to the L4-5 facette as well  as out laterally over the transverse processes. Dissection was continued out  laterally exposing the transverse processes of L5 as well as the sacral ala  bilaterally. Deep retractors were placed and intraoperative x-rays were  taken to confirm location. We then completed a revision lumbar laminectomy  on the left side by carefully dissecting off the previous epidural fibrosis  from the edges of the lamina. The spinal canal was then entered and using  curettes, the laminotomy was extended out laterally. A complete facetectomy  was performed. We identified the L5 and S1 nerve roots which were scarred  down to the underlying disk space.  Dissection with headlights and loupe  magnification was utilized to complete neurolysis. We were then able to  mobilize the L5 and S1 nerve roots.   We then turned our attention to placing pedicle screws at L5-S1 using  anatomic probing technique. We utilized 6.5 x 50 mm screws at L5 and 7.5 x  35 mm screws in the sacrum. At each of pedicle hole was initiated with the  awl. The pedicle was then cannulated using the Steffee pedicle probe.  The  pedicles were probed with the ball-tip feeler; there were no breeches. Each  screw was then stimulated using triggered EMGs and there were no deleterious  changes. The bone quality was good and the screw purchase was excellent.   We then turned our attention to completing the transforaminal lumbar  interbody fusion on the left side at L5-S1. The residual facette joint was  removed. The exiting and transversing nerve roots  were identified. Free  running EMGs were monitored. Sharp annulotomy was completed. We then  distracted the disk space open using the pedicle screws and used various  curettes to scrape the disk space clean and remove the cartilaginous  endplates. The disk space was then packed with BMP sponges from the medium  infuse kit along with local bone graft obtained from the laminectomy. A 7-mm  peak spacer was then packed with the BMP sponges and carefully inserted  anteriorly, and tamped across the midline. There were no changes in the  EMGs. We then completed the posterior spinal fusion by decorticating the  transverse process of L5 and S1, bilaterally packed, and the local bone  graft into the lateral gutters.  40-mm titanium rods were placed into the  polyaxial screw heads; gentle compression was applied before shearing off  the locking caps.. Hemostasis was achieved. The L5 nerve root was examined  again on the left side and now found to be complete decompressed out the  foramen. A deep Hemovac drain was left in place. The deep fascia closed with  a running #1 Vicryl suture. Subcutaneous layer closed with O Vicryl, 2-0  Vicryl, followed by a running 3-0 subcuticular Vicryl suture on the skin  edges. Benzoin and Steri-Strips placed. Sterile dressing applied. The  patient was extubated without difficulty, transferred to recovery room in  stable condition, able to move her upper lower extremities.      Sharolyn Douglas, M.D.  Electronically Signed     MC/MEDQ  D:  12/10/2004  T:  12/10/2004  Job:  [id number]

## 2010-07-31 NOTE — Op Note (Signed)
NAME:  Lisa [name], Lisa [name]                        ACCOUNT NO.:  [id number]   MEDICAL RECORD NO.:  [id number]                   PATIENT TYPE:  OBV   LOCATION:  9399                                 FACILITY:  [facility]   PHYSICIAN:  Laqueta Linden, M.D.                 DATE OF BIRTH:  1955-05-07   DATE OF PROCEDURE:  04/17/2002  DATE OF DISCHARGE:                                 OPERATIVE REPORT   PREOPERATIVE DIAGNOSES:  1. Menorrhagia.  2. Dysfunctional uterine bleeding unresponsive to medical management.  3. Rule out adenomyosis.   POSTOPERATIVE DIAGNOSES:  1. Menorrhagia.  2. Dysfunctional uterine bleeding unresponsive to medical management.  3. Rule out adenomyosis.   PROCEDURE:  Transvaginal hysterectomy.   SURGEON:  Laqueta Linden, M.D.   ASSISTANT:  Andres Ege, M.D.   ANESTHESIA:  General endotracheal.   ESTIMATED BLOOD LOSS:  Less than 50 mL.   URINE OUTPUT:  100 mL in-and-out catheterization at the conclusion of the  procedure.   FLUIDS:  1700 mL crystalloid.   COUNTS:  Correct x2.   COMPLICATIONS:  None.   INDICATIONS:  The patient is a 55 year old, gravida 5, para 3, white female  with long-term menorrhagia initially responsive to progesterone only pills  now with worsening dysfunctional uterine bleeding and uterine tenderness  consistent with adenomyosis.  Evaluation has included an endometrial biopsy  revealing benign secretory endometrium, an ultrasound revealing a slightly  enlarged uterus with normal ovaries and streaking through the myometrium  suspicious for adenomyosis.  Sonohysterogram was negative for intrauterine  lesion.   The patient was not a candidate for combination birth control pills as she  is a smoker.  She has used progesterone only pills up to b.i.d. with initial  response and currently has been bleeding almost daily with increasing side  effects.  She was offered alternatives including continued medical  management versus  endometrial ablation versus hysterectomy and desired to  proceed to definitive surgical management.  She and her husband have seen  the informed consent film, have voiced their understanding and acceptance of  all risks including, but not limited to, anesthesia risks, infection,  bleeding possibly requiring transfusion, injury to bowel, bladder, ureters,  vessels, nerves, the possibility of fistula formation, postoperative  complications including DVT, PE, pneumonia, and death as well as  postoperative expectations regarding recovery, return to work, and sexual  functioning.  She desires ovarian retention if the ovaries appear normal and  understands that she will go through a gradual menopause.  She also  understands she will be permanently and irreversibly sterilized as a result  of this procedure.  She has voiced her understanding of all of the above as  well as alternatives to the procedure and agrees to proceed.  She has  received Ancef 1 g IV antibiotic prophylaxis preoperatively.   PROCEDURE:  The patient was taken to the operating room and, after  proper  identification and consents were ascertained, she was placed on the  operating table in the supine position.  After the induction of general  endotracheal anesthesia, she was placed in the dorsal lithotomy position and  the perineum and vagina were prepped and draped in a routine sterile  fashion.  The bladder was entered with a red rubber catheter.   A weighted speculum was placed in the posterior vagina and the cervix was  grasped with a single tooth tenaculum.  The portio was then injected  circumferentially with 10 mL with 1:100,00 epinephrine.  The cervix was then  circumscribed with a scalpel.  The posterior vaginal mucosa was then tented  down and the cul-de-sac peritoneum entered sharply using curved Mayo  scissors.  The mucosa was then advanced off of the cervix using a finger and  a Raytec sponge.   Curved Heaney  clamps were then placed across the uterosacral ligaments  bilaterally with the pedicles cut and suture ligated with #0 Vicryl and  tagged.  The cardinal ligaments were similarly clamped, cut, and suture  ligated and tagged.  The anterior peritoneal reflection was then identified  and entered sharply without obvious injury or entry into the bladder.  The  uterine vessels were then clamped bilaterally incorporating both anterior  and posterior peritoneum with the pedicles suture ligated with #0 Vicryl.  The uterine fundus was then flipped posteriorly and curved Heaney clamps  were placed across both adnexal pedicles with excision of the specimen which  was sent to pathology.   Both tubes and ovaries appeared completely normal with no evidence of  endometriosis or adhesive disease.  The adnexal pedicles were triply ligated  with two free ties and a stitch of #0 Vicryl.  A McCall suture was then  placed through the vaginal mucosa to plicate the uterosacral ligaments to  prevent enterocele formation.  The suture was tied at the very end of the  procedure.  The posterior peritoneum was reefed to the posterior vaginal  cuff for hemostasis.   The parietal peritoneum was then closed in a pursestring fashion using  2-0  Vicryl suture.  The adnexal pedicles were noted to be hemostatic and were  released into the peritoneal cavity.  Counts were correct prior to closure  of the peritoneum.  At this point, the cardinal ligament pedicles tags were  then cut.  The uterosacral tags were then tied in the midline.  The vaginal  cuff was then closed from side to side using interrupted figure-of-eight  sutures of #0 Vicryl.  The McCall suture was then tied.  Hemostasis was  noted to be excellent.  The bladder was then emptied of 100 mL of clear  urine which had accumulated in the less than one hour procedure.  This was done as an in-and-out catheterization and the catheter was not left in  place.   The  patient was x-rayed and stable on transfer to the recovery room.   ESTIMATED BLOOD LOSS:  Less than 50 mL.   URINE OUTPUT:  100 mL.   FLUIDS:  1700 mL crystalloid.    COUNTS:  Correct x2.   COMPLICATIONS:  None.                                               Laqueta Linden, M.D.    LKS/MEDQ  D:  04/17/2002  T:  04/17/2002  Job:  [id number]   cc:   Carney Living., N.P.. [facility]

## 2010-07-31 NOTE — Discharge Summary (Signed)
NAME:  Lisa [name], Lisa [name]                        ACCOUNT NO.:  [id number]   MEDICAL RECORD NO.:  [id number]                   PATIENT TYPE:  INP   LOCATION:  3008                                 FACILITY:  [facility]   PHYSICIAN:  Hilda Lias, M.D.                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   HISTORY OF PRESENT ILLNESS:  The patient was admitted because of back pain  with radiation down to the right leg.  X-rays showed stenosis at the level  of L4-5.  Surgery was advised by Dr. Franky Macho.   LABORATORY DATA:  Normal.   HOSPITAL COURSE:  The patient was taken to surgery and L4-5 hemilaminectomy  and foraminotomy was accomplished.  Today, the patient is feeling much  better and she feels that she is ready to go home.   DISCHARGE CONDITION:  Improved.   DISCHARGE MEDICATIONS:  1. Percocet.  2. Flexeril.   DIET:  Regular.   ACTIVITY:  She is not to drive.  She is not to do any lifting.   FOLLOWUP:  She is going to call the office to set up an appointment with Dr.  Franky Macho.                                                Hilda Lias, M.D.    EB/MEDQ  D:  09/14/2003  T:  09/16/2003  Job:  [id number]

## 2010-07-31 NOTE — Discharge Summary (Signed)
Lisa, [name] NO.:  [id number]   MEDICAL RECORD NO.:  [id number]          PATIENT TYPE:  INP   LOCATION:  5018                         FACILITY:  [facility]   PHYSICIAN:  Sharolyn Douglas, M.D.        DATE OF BIRTH:  01-19-1956   DATE OF ADMISSION:  12/10/2004  DATE OF DISCHARGE:  12/13/2004                                 DISCHARGE SUMMARY   ADMISSION DIAGNOSES:  1.  Degenerative disc disease and foraminal narrowing at L5-S1.  2.  Hypothyroidism.  3.  Hypertension.  4.  Mild depression.   DISCHARGE DIAGNOSES:  1.  Status post L5-S1 posterior spinal fusion, doing well.  2.  Postoperative hyponatremia.  3.  Mild postoperative anemia.  4.  Hypothyroidism.  5.  Hypertension.  6.  Mild depression.   PROCEDURE:  On December 10, 2004, the patient was taken to the operating  room for a L5-S1 posterior spinal fusion with pedicle screws and  ___________, as well as BMP.   SURGEON:  Sharolyn Douglas, M.D.   ASSISTANT:  Verlin Fester, P.A.   ANESTHESIA:  General.   CONSULTATIONS:  None.   LABORATORY DATA:  Preoperatively, CBC with diff was within normal limits,  with the exception of a white count of 10.9. Postoperative H&H was monitored  daily, developed very mild anemia, with a hematocrit of 35.4 on September 29  and a hematocrit of 35.9 on October 1. PT/INR and PTT preoperatively were  normal. Complete metabolic panel preoperatively was within normal limits,  with the exception of a sodium of 133. Postoperatively, basic metabolic  panel was monitored x2 days, glucose was slightly elevated at 100 on  postoperative day 1 and 101 on postoperative day 2. Postoperative day 2, she  also had a BUN of 3 and a calcium of 8.3, otherwise basic metabolic panel  was within normal limits. UA preop was negative. Blood typing preop: Type A.  RSR positive, antibody screen negative. EKG from September 22 shows normal  sinus rhythm, rightward axis, nonspecific T wave abnormality. No  previous  ECG available, read by Corliss Marcus. X-rays: Portable spine was used  intraoperative for localization and confirm screw placement of L5-S1.   HISTORY OF PRESENT ILLNESS:  The patient is a 55 year old female that had  progressive problems related to her back, with radiation into her left lower  extremity. This has been going on for an excess of 1 year. She has surgery  back in 2005, which was microdiscectomy at L5-S1, which she did very well  with for a short period of time, unfortunately her pain did return. Dr.  Ethelene Hal had treated her with epidural steroid injections, activity  modification, as well as medications and she failed to have any lasting  relief of her symptoms. She was referred to Dr. Noel Gerold for consideration of  surgery and she was found to have foraminal narrowing and degenerative disc  disease. He thought her best course of management would be a posterior  spinal fusion procedure from a surgical standpoint.   After discussing the risks and benefits of this procedure with the patient  at length, the patient did decide to proceed with surgery.   HOSPITAL COURSE:  On December 10, 2004, the patient was admitted to the  hospital and taken to the operating room for the above procedure. She  tolerated the procedure well, without any intraoperative complications. She  was transferred to the recovery room in stable condition.   Routine orthopedic spine protocol was followed postoperatively and she  progressed along very well.   By postoperative day 1, her leg pain that she had been having for nearly a  year preoperatively had completely resolved. She was having back pain as  expected. Her PCA was helping her with her pain and we added p.o. analgesics  to help her as well.   On postoperative day 2, her Hemovac drain was discontinued without  difficulty. Her dressing was changed that day and every day thereafter until  discharge and her incision continued to look good  throughout her hospital  stay, without any signs or symptoms of infection. On postoperative day 2,  her Foley was also discontinued, as well as her PCA discontinued.   Physical therapy and occupational therapy worked with the patient on a daily  basis on her back precautions, brace use, ADLs and ambulation. She  progressed along very well with them and got to the point that she was  completely independent, safe and understood all back precautions by the day  of discharge on October 1.   By December 13, 2004, the patient had met all orthopedic goals. She was  medically stable and ready for discharge home.   DISCHARGE PLAN:  The patient is a 55 year old female, status post posterior  spinal fusion L5-S1, doing well. Activities, daily __________ program, brace  on when she is up. Back precautions at all times. No lifting heavier than 5  pounds. Dressing change daily. Keep incision dry until all drainage stops  from incision. Follow up in 2 weeks postoperatively in Dr. Wells Guiles clinic,  call for an appointment.   DISCHARGE DIET:  Regular home diet as tolerated.   DISCHARGE MEDICATIONS:  1.  Percocet for pain.  2.  Robaxin for muscle spasm.  3.  Multivitamin daily.  4.  Calcium 1000 to 1200 daily.  5.  Colace 100 mg twice daily as needed.  6.  Laxative as needed.  7.  Avoid NSAIDS x2 months.  8.  Resume all medications.   CONDITION ON DISCHARGE:  Stable, improved.   DISPOSITION:  The patient is being discharged to home with family  assistance, as well as home physical therapy and occupational therapy.      Verlin Fester, P.A.      Sharolyn Douglas, M.D.  Electronically Signed    CM/MEDQ  D:  02/24/2005  T:  02/25/2005  Job:  [id number]   cc:   Sharolyn Douglas, M.D.  Fax: [phone]

## 2010-07-31 NOTE — H&P (Signed)
[name], [name] NO.:  [id number]   MEDICAL RECORD NO.:  [id number]          PATIENT TYPE:  INP   LOCATION:                               FACILITY:  [facility]   PHYSICIAN:  Sharolyn Douglas, M.D.        DATE OF BIRTH:  1955/11/08   DATE OF ADMISSION:  12/10/2004  DATE OF DISCHARGE:                                HISTORY & PHYSICAL   DATE OF ADMISSION:  December 10, 2004   This History and Physical was performed in our office on December 03, 2004  at which time the patient was fitted with an EBI lumbosacral orthosis which  will be used postoperatively.   CHIEF COMPLAINT:  Pain in my back and left leg.   PRESENT ILLNESS:  This 55 year old female with persistent and progressive  low-back pain with radiation to left lower extremities. She has had this  going on for well over a year. She had surgery back in 2005 to her lumbar  spine and she believes it is an L5-S1 microdiskectomy with Dr. Franky Macho. She  did very well after that until she marked increase in pain which has been  interfering with her lifestyle. Dr. Ethelene Hal has been treating her with  epidural steroid injections. He has referred her to [place] for surgical  intervention. She now has developed tingling and numbness into her left leg  and foot. She is trying to maintain her day-to-day activities but this is  markedly interfering with same. After much discussion including the risks  and benefits of surgery, the patient was scheduled for posterior spinal  fusion L5-S1. She has been cleared preoperatively by the [facility]  [place] by Rosezella Florida, MSN, family nurse practitioner, and Dr.  Dara Hoyer.   PAST MEDICAL HISTORY:  The patient has hypertension, has some mild  depression. She also has hypoactive thyroid. She also has chronic anemia.   CURRENT MEDICATIONS:  1.  Lisinopril 20 mg tablets one daily.  2.  Toprol-XL 100 mg daily.  3.  Synthroid 50 mcg one daily.  4.  Alprazolam 0.25 mg  daily.  5.  Zoloft 100 mg daily.  6.  Tramadol 50 mg q.i.d.  7.  Oxycodone for discomfort.  8.  Iron tablet daily.  9.  B12 shot once a week.  10. Ambien 10 mg at bedtime for sleep.  11. Neurontin 300 mg t.i.d. p.r.n. pain.   She is allergic to CODEINE.   Family physician is Dr. Arlyce Dice and Ms. Renette Butters of [facility].   PAST SURGERIES:  1.  Laminectomy in June 2005 as mentioned above.  2.  In 1993, plantar fasciitis, removal of heel spur.  3.  In 2004, hysterectomy.  4.  In 1973, appendectomy.   FAMILY HISTORY:  Positive for heart disease in a sister and mother.  Hypertension in her mother. Diabetes in her mother.   SOCIAL HISTORY:  The patient is married. 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 Has one pack of cigarettes per day, no  intake of alcohol. Has three  children and her husband, [name], will be the  major caregiver after surgery.   REVIEW OF SYSTEMS:  CNS:  No seizure disorder, paralysis, or double vision,  but the patient does have numbness and tingling consistent with radiculitis  into the left lower extremity. CARDIOVASCULAR:  No chest pain, no angina or  orthopnea. RESPIRATORY:  No productive cough, no hemoptysis, no shortness of  breath. GASTROINTESTINAL:  No nausea, vomiting, melena, or bloody stool.  GENITOURINARY:  No discharge, dysuria, hematuria. MUSCULOSKELETAL:  Primarily in present illness.   PHYSICAL EXAMINATION:  GENERAL:  Alert and cooperative, friendly, somewhat  apprehensive 55 year old white female who moves carefully during the  examination. She must come to a standing position from time to time for her  comfort.  VITAL SIGNS:  Blood pressure 140/82, pulse 72, respirations 12.  HEENT:  Normocephalic. PERRLA, EOM intact. Oropharynx is clear.  CHEST:  Clear to auscultation, no rhonchi, no rales.  HEART:  With regular rate and rhythm, no murmurs are heard.  ABDOMEN:  Soft, nontender. Liver and spleen not  felt.  GENITALIA, RECTAL, PELVIC, BREAST:  Not done, not pertinent to present  illness.  EXTREMITIES:  Reflexes are intact to the lower extremities and hypoactive  but symmetric. Strength is tested with give-away weakness left lower  extremity. Positive straight leg raise is seen on the left at 60 degrees.   ADMITTING DIAGNOSES:  1.  Degenerative disc disease with foraminal narrowing at L5-S1.  2.  Hypothyroidism.  3.  Hypertension.  4.  Mild depression.   PLAN:  The patient will be admitted for L5-S1 posterior spinal fusion with  pedicle screws, transforaminal lumbar interbody fusion, with bone  morphogenic protein.      Dooley L. Cherlynn June.      Sharolyn Douglas, M.D.  Electronically Signed    DLU/MEDQ  D:  12/04/2004  T:  12/04/2004  Job:  [id number]   cc:   Rosezella Florida, MSN, FNP   Teena Irani. Arlyce Dice, M.D.  Fax: [phone]

## 2010-08-24 ENCOUNTER — Telehealth: Payer: Self-pay | Admitting: Cardiovascular Disease

## 2010-08-24 NOTE — Telephone Encounter (Signed)
Spoke with patient and let her know that 6 weeks post sent it endothelialized and this should not be the cause of her pain  She should call her primary MD

## 2010-08-24 NOTE — Telephone Encounter (Signed)
Since pt had stent placed she has had stabbing pains in her stomach and she is wondering would the stent be moving around

## 2010-10-13 ENCOUNTER — Telehealth: Payer: Self-pay | Admitting: Cardiovascular Disease

## 2010-10-13 DIAGNOSIS — E785 Hyperlipidemia, unspecified: Secondary | ICD-10-CM

## 2010-10-13 NOTE — Telephone Encounter (Addendum)
Patient taking Crestor 40 mg daily. She is calling complaining of leg cramps. Spoke with Dr. Clifton James, will have patient come into the office to check a CK level and LFTs and to decrease her Crestor to 20 mg daily. LVMTCB.

## 2010-10-13 NOTE — Telephone Encounter (Signed)
C/O cramping from medicaition crestor.

## 2010-10-13 NOTE — Telephone Encounter (Signed)
Patient aware to come in to the office for blood work. She is also going to decrease her Crestor to 20 mg daily.

## 2010-10-13 NOTE — Telephone Encounter (Signed)
Per pt call, returning call to [facility].

## 2010-10-16 ENCOUNTER — Other Ambulatory Visit (INDEPENDENT_AMBULATORY_CARE_PROVIDER_SITE_OTHER): Payer: PRIVATE HEALTH INSURANCE | Admitting: *Deleted

## 2010-10-16 DIAGNOSIS — E785 Hyperlipidemia, unspecified: Secondary | ICD-10-CM

## 2010-10-16 LAB — HEPATIC FUNCTION PANEL
Albumin: 4.1 g/dL (ref 3.5–5.2)
Bilirubin, Direct: 0.1 mg/dL (ref 0.0–0.3)
Total Protein: 6.8 g/dL (ref 6.0–8.3)

## 2010-11-02 ENCOUNTER — Telehealth: Payer: Self-pay | Admitting: Cardiovascular Disease

## 2010-11-02 NOTE — Telephone Encounter (Signed)
Informed pharmacist that Ms. Santini can take generic Plavix. Refilled this for her.

## 2010-11-02 NOTE — Telephone Encounter (Signed)
Please Clarify gentric plavix. " the message states refill not approximated on denied message. Pt stated she  has not been told to stop taken it.

## 2010-11-30 ENCOUNTER — Encounter: Payer: Self-pay | Admitting: Cardiovascular Disease

## 2010-12-01 ENCOUNTER — Other Ambulatory Visit: Payer: Self-pay | Admitting: Cardiovascular Disease

## 2010-12-01 ENCOUNTER — Ambulatory Visit (INDEPENDENT_AMBULATORY_CARE_PROVIDER_SITE_OTHER): Payer: PRIVATE HEALTH INSURANCE | Admitting: Cardiovascular Disease

## 2010-12-01 ENCOUNTER — Encounter: Payer: Self-pay | Admitting: Cardiovascular Disease

## 2010-12-01 ENCOUNTER — Encounter (INDEPENDENT_AMBULATORY_CARE_PROVIDER_SITE_OTHER): Payer: PRIVATE HEALTH INSURANCE | Admitting: *Deleted

## 2010-12-01 VITALS — BP 104/62 | HR 59 | Resp 16 | Ht 64.0 in | Wt 144.0 lb

## 2010-12-01 DIAGNOSIS — I739 Peripheral vascular disease, unspecified: Secondary | ICD-10-CM

## 2010-12-01 DIAGNOSIS — F17200 Nicotine dependence, unspecified, uncomplicated: Secondary | ICD-10-CM

## 2010-12-01 DIAGNOSIS — I2510 Atherosclerotic heart disease of native coronary artery without angina pectoris: Secondary | ICD-10-CM

## 2010-12-01 DIAGNOSIS — E785 Hyperlipidemia, unspecified: Secondary | ICD-10-CM

## 2010-12-01 MED ORDER — NITROGLYCERIN 0.4 MG SL SUBL: 0.4000 mg | SUBLINGUAL_TABLET | SUBLINGUAL | Status: DC | PRN

## 2010-12-01 MED ORDER — ROSUVASTATIN CALCIUM 5 MG PO TABS: 5.0000 mg | ORAL_TABLET | Freq: Every day | ORAL | Status: DC

## 2010-12-01 MED ORDER — CLOPIDOGREL BISULFATE 75 MG PO TABS: 75.0000 mg | ORAL_TABLET | Freq: Every day | ORAL | Status: DC

## 2010-12-01 NOTE — Patient Instructions (Signed)
Your physician wants you to follow-up in: 6 months.  You will receive a reminder letter in the mail two months in advance. If you don't receive a letter, please call our office to schedule the follow-up appointment.  Your physician recommends that you return for fasting  lab work in: 12 weeks--week of February 22, 2011. The lab opens at 8:30.  Lipid and Liver profile.  Your physician has recommended you make the following change in your medication:  Start Crestor 5 mg by mouth daily.

## 2010-12-01 NOTE — Progress Notes (Signed)
History of Present Illness:55 yo WF with history of CAD, tobacco abuse, PAD, HTN, hyperlipidemia, hypothyroidism  here today for follow up.  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 In April 2011 she had stenting of the right common  iliac artery.  She had a negative stress ECG 5/11. Her cardiac issues had been followed by Dr. Juanda Chance. I last saw her in March 2012 and she c/o palpitations and awareness of irregularity of her heart rhythm. There was associated pressure in the chest with the skipped beats. No exertional chest pain or pressure. No SOB. I offered her a Holter monitor but she refused.   She is here today for follow up. She c/o back pain. No pain in her lower extremities with walking. NO chest pain or SOB. She continues to smoke 1/2 ppd . Overall doing well. She did not tolerate high doses of Crestor because of muscle cramps. She had normal CK and LFTs during these cramps. She has been off of Crestor for two months.   Past Medical History  Diagnosis Date  . Hypertension   . Tobacco user   . Overweight   . Thyroid disease     hypothyroid  . Anemia   . PVD (peripheral vascular disease)   . Carotid artery occlusion     inferior stemi    Past Surgical History  Procedure Date  . Back surgery   . Appendectomy     Current Outpatient Prescriptions  Medication Sig Dispense Refill  . ALPRAZolam (XANAX) 0.5 MG tablet Take 0.5 mg by mouth 2 (two) times daily.        Marland Kitchen aspirin 81 MG tablet Take 81 mg by mouth daily.        Marland Kitchen atenolol (TENORMIN) 50 MG tablet Take 50 mg by mouth 2 (two) times daily.        . clopidogrel (PLAVIX) 75 MG tablet Take 75 mg by mouth daily.        . Cyanocobalamin (VITAMIN B-12 IJ) Inject as directed every 30 (thirty) days.        Marland Kitchen HYDROcodone-acetaminophen (NORCO) 5-325 MG per tablet Take 1 tablet by mouth every 6 (six) hours as needed.        Marland Kitchen levothyroxine (SYNTHROID, LEVOTHROID) 75 MCG tablet Take 75 mcg by mouth daily.         Marland Kitchen lisinopril (PRINIVIL,ZESTRIL) 20 MG tablet Take 20 mg by mouth daily.        . nitroGLYCERIN (NITROSTAT) 0.4 MG SL tablet Place 0.4 mg under the tongue every 5 (five) minutes as needed.        . zolpidem (AMBIEN) 5 MG tablet Take 5 mg by mouth at bedtime as needed.          Allergies  Allergen Reactions  . Codeine   . Prednisone     History   Social History  . Marital Status: Married    Spouse Name: N/A    Number of Children: N/A  . Years of Education: N/A   Occupational History  . Not on file.   Social History Main Topics  . Smoking status: Current Everyday Smoker  . Smokeless tobacco: Not on file  . Alcohol Use: No  . Drug Use: No  . Sexually Active:    Other Topics Concern  . Not on file   Social History Narrative  . No narrative on file    Family History  Problem Relation Age of Onset  .  Coronary artery disease    . Hypertension Mother   . Diabetes Mother   . Heart disease Father   . Heart attack Sister 40    Review of Systems:  As stated in the HPI and otherwise negative.   BP 104/62  Pulse 59  Resp 16  Ht 5\' 4"  (1.626 m)  Wt 144 lb (65.318 kg)  BMI 24.72 kg/m2  Physical Examination: General: Well developed, well nourished, NAD HEENT: OP clear, mucus membranes moist SKIN: warm, dry. No rashes. Neuro: No focal deficits Musculoskeletal: Muscle strength 5/5 all ext Psychiatric: Mood and affect normal Neck: No JVD, no carotid bruits, no thyromegaly, no lymphadenopathy. Lungs:Clear bilaterally, no wheezes, rhonci, crackles Cardiovascular: Regular rate and rhythm. No murmurs, gallops or rubs. Abdomen:Soft. Bowel sounds present. Non-tender.  Extremities: No lower extremity edema. Pulses are 2 + in the bilateral DP/PT.  EKG:NSR, rate 61 bpm. Normal EKG.   ABI 12/01/10: ABI 1.0 on right and 1.1 on the left.

## 2010-12-01 NOTE — Assessment & Plan Note (Addendum)
Stable. No chest pain. Normal EKG. Continue ASA, Plavix, beta blocker, Ace-inh. Will try low dose Crestor 5 mg po QHS. Samples given today (#28).

## 2010-12-01 NOTE — Progress Notes (Signed)
History of Present Illness:   Past Medical History  Diagnosis Date  . Hypertension   . Tobacco user   . Overweight   . Thyroid disease     hypothyroid  . Anemia   . PVD (peripheral vascular disease)   . Carotid artery occlusion     inferior stemi    Past Surgical History  Procedure Date  . Back surgery   . Appendectomy     Current Outpatient Prescriptions  Medication Sig Dispense Refill  . ALPRAZolam (XANAX) 0.5 MG tablet Take 0.5 mg by mouth 2 (two) times daily.        Marland Kitchen aspirin 81 MG tablet Take 81 mg by mouth daily.        Marland Kitchen atenolol (TENORMIN) 50 MG tablet Take 50 mg by mouth 2 (two) times daily.        . clopidogrel (PLAVIX) 75 MG tablet Take 75 mg by mouth daily.        . Cyanocobalamin (VITAMIN B-12 IJ) Inject as directed every 30 (thirty) days.        Marland Kitchen HYDROcodone-acetaminophen (NORCO) 5-325 MG per tablet Take 1 tablet by mouth every 6 (six) hours as needed.        Marland Kitchen levothyroxine (SYNTHROID, LEVOTHROID) 75 MCG tablet Take 75 mcg by mouth daily.        Marland Kitchen lisinopril (PRINIVIL,ZESTRIL) 20 MG tablet Take 20 mg by mouth daily.        . nitroGLYCERIN (NITROSTAT) 0.4 MG SL tablet Place 0.4 mg under the tongue every 5 (five) minutes as needed.        . zolpidem (AMBIEN) 5 MG tablet Take 5 mg by mouth at bedtime as needed.          Allergies  Allergen Reactions  . Codeine   . Prednisone     History   Social History  . Marital Status: Married    Spouse Name: N/A    Number of Children: N/A  . Years of Education: N/A   Occupational History  . Not on file.   Social History Main Topics  . Smoking status: Current Everyday Smoker  . Smokeless tobacco: Not on file  . Alcohol Use: No  . Drug Use: No  . Sexually Active:    Other Topics Concern  . Not on file   Social History Narrative  . No narrative on file    Family History  Problem Relation Age of Onset  . Coronary artery disease    . Hypertension Mother   . Diabetes Mother   . Heart disease Father     . Heart attack Sister 40    Review of Systems:  As stated in the HPI and otherwise negative.   There were no vitals taken for this visit.  Physical Examination: General: Well developed, well nourished, NAD HEENT: OP clear, mucus membranes moist SKIN: warm, dry. No rashes. Neuro: No focal deficits Musculoskeletal: Muscle strength 5/5 all ext Psychiatric: Mood and affect normal Neck: No JVD, no carotid bruits, no thyromegaly, no lymphadenopathy. Lungs:Clear bilaterally, no wheezes, rhonci, crackles Cardiovascular: Regular rate and rhythm. No murmurs, gallops or rubs. Abdomen:Soft. Bowel sounds present. Non-tender.  Extremities: No lower extremity edema. Pulses are 2 + in the bilateral DP/PT.  EKG:

## 2010-12-01 NOTE — Assessment & Plan Note (Signed)
Will restart statin. Lipids recheck in 12 weeks.

## 2010-12-01 NOTE — Assessment & Plan Note (Signed)
Smoking cessation encouraged. She wishes to stop but does not want any patched or medications for assistance.

## 2010-12-01 NOTE — Assessment & Plan Note (Signed)
Stable. ABI normal today. Repeat ABI one year.

## 2010-12-25 ENCOUNTER — Telehealth: Payer: Self-pay | Admitting: Cardiovascular Disease

## 2010-12-25 NOTE — Telephone Encounter (Signed)
Spoke with pt who reports doctor she sees for her back and sciatic nerve pain has prescribed nortriptyline for her but she was told to check with Dr. Clifton James before starting it  to see if OK with him.  She does not know dose.  I told pt I would sent to Dr. Clifton James to review

## 2010-12-25 NOTE — Telephone Encounter (Signed)
Pt called and wants to know if she can take a new medication that was ordered by her back surgeon.  Needs to know if it will interfere with her other meds.

## 2010-12-30 NOTE — Telephone Encounter (Signed)
This medication should be ok. Thanks, chris

## 2010-12-31 ENCOUNTER — Telehealth: Payer: Self-pay | Admitting: Cardiovascular Disease

## 2010-12-31 NOTE — Telephone Encounter (Signed)
See previous phone note for documentation regarding this phone call.

## 2010-12-31 NOTE — Telephone Encounter (Signed)
Spoke with pt and gave her information from Dr. McAlhany 

## 2010-12-31 NOTE — Telephone Encounter (Signed)
Pt returning your call

## 2010-12-31 NOTE — Telephone Encounter (Signed)
Left message to call back  

## 2011-01-22 ENCOUNTER — Other Ambulatory Visit: Payer: Self-pay | Admitting: Orthopedic Surgery

## 2011-01-22 DIAGNOSIS — M545 Low back pain: Secondary | ICD-10-CM

## 2011-01-22 DIAGNOSIS — M79605 Pain in left leg: Secondary | ICD-10-CM

## 2011-01-28 ENCOUNTER — Ambulatory Visit
Admission: RE | Admit: 2011-01-28 | Discharge: 2011-01-28 | Disposition: A | Discharge status: Home / Self Care | Payer: PRIVATE HEALTH INSURANCE | Source: Ambulatory Visit | Attending: Orthopedic Surgery | Admitting: Orthopedic Surgery

## 2011-01-28 DIAGNOSIS — M545 Low back pain: Secondary | ICD-10-CM

## 2011-01-28 DIAGNOSIS — M79605 Pain in left leg: Secondary | ICD-10-CM

## 2011-01-28 MED ORDER — DIAZEPAM 5 MG PO TABS
10.0000 mg | ORAL_TABLET | Freq: Once | ORAL | Status: AC
  Administered 2011-01-28: 10 mg via ORAL

## 2011-01-28 MED ORDER — IOHEXOL 180 MG/ML  SOLN
15.0000 mL | Freq: Once | INTRAMUSCULAR | Status: AC | PRN
  Administered 2011-01-28: 15 mL via INTRAVENOUS

## 2011-01-28 NOTE — Patient Instructions (Signed)

## 2011-02-23 ENCOUNTER — Other Ambulatory Visit: Payer: PRIVATE HEALTH INSURANCE | Admitting: *Deleted

## 2011-02-25 ENCOUNTER — Other Ambulatory Visit (INDEPENDENT_AMBULATORY_CARE_PROVIDER_SITE_OTHER): Payer: PRIVATE HEALTH INSURANCE | Admitting: *Deleted

## 2011-02-25 ENCOUNTER — Other Ambulatory Visit: Payer: Self-pay | Admitting: Cardiovascular Disease

## 2011-02-25 DIAGNOSIS — E785 Hyperlipidemia, unspecified: Secondary | ICD-10-CM

## 2011-02-25 LAB — HEPATIC FUNCTION PANEL
ALT: 13 U/L (ref 0–35)
AST: 16 U/L (ref 0–37)
Albumin: 4 g/dL (ref 3.5–5.2)
Total Bilirubin: 0.4 mg/dL (ref 0.3–1.2)

## 2011-02-25 LAB — LIPID PANEL
Cholesterol: 207 mg/dL — ABNORMAL HIGH (ref 0–200)
Total CHOL/HDL Ratio: 3
Triglycerides: 107 mg/dL (ref 0.0–149.0)
VLDL: 21.4 mg/dL (ref 0.0–40.0)

## 2011-02-26 ENCOUNTER — Telehealth: Payer: Self-pay | Admitting: Cardiovascular Disease

## 2011-02-26 NOTE — Telephone Encounter (Signed)
FU Call: Pt returning call from [facility] to get cholesterol results. Pt also wanted to talk to nurse about getting surgical clearance for a upcoming surgery. Please return pt call to discuss further.

## 2011-03-01 ENCOUNTER — Telehealth: Payer: Self-pay | Admitting: Cardiovascular Disease

## 2011-03-01 NOTE — Telephone Encounter (Signed)
Fu from previous call from 12-14: pt rtn call re blood results and requesting a surgical clearance

## 2011-03-01 NOTE — Telephone Encounter (Signed)
Patient called, stated she is taking crestor 5 mg every other day.States has muscle pain if takes 5 mg every day.Will forward to Dr.McAlhany for advice and needs surgical clearance for back surgery scheduled with Dr.Cohen 03/15/11.Fax clearance to attention [facility] fax #[phone].

## 2011-03-01 NOTE — Telephone Encounter (Signed)
error 

## 2011-03-03 ENCOUNTER — Telehealth: Payer: Self-pay | Admitting: Cardiovascular Disease

## 2011-03-03 NOTE — Telephone Encounter (Signed)
Fu call °Pt returning your call  °

## 2011-03-03 NOTE — Telephone Encounter (Signed)
Left message to call back  

## 2011-03-03 NOTE — Telephone Encounter (Signed)
OK to hold ASA and Plavix for surgical procedure. Resume after her procedure. Thanks, Media planner

## 2011-03-03 NOTE — Telephone Encounter (Signed)
Pt aware it is OK to hold ASA and Plavix prior to procedure. Will send this note to Dr. Noel Gerold.

## 2011-03-03 NOTE — Telephone Encounter (Signed)
OK to take Crestor every other day. She will not need to be seen for surgical clearance if she is doing ok. Can we let her know and fax clearance to her surgeon? Thanks, chris

## 2011-03-03 NOTE — Telephone Encounter (Signed)
Spoke with pt. She will continue Crestor 5 mg every other day. I told her I would send clearance to Dr. Noel Gerold at number listed below. Needs OK to stop Plavix and ASA for 7-8 days prior to procedure.

## 2011-03-04 ENCOUNTER — Other Ambulatory Visit: Payer: Self-pay | Admitting: *Deleted

## 2011-03-04 DIAGNOSIS — E785 Hyperlipidemia, unspecified: Secondary | ICD-10-CM

## 2011-03-04 MED ORDER — ROSUVASTATIN CALCIUM 5 MG PO TABS: ORAL_TABLET | ORAL | Status: DC

## 2011-05-11 ENCOUNTER — Other Ambulatory Visit: Payer: Self-pay | Admitting: Cardiovascular Disease

## 2011-06-09 ENCOUNTER — Ambulatory Visit: Payer: PRIVATE HEALTH INSURANCE | Admitting: Cardiovascular Disease

## 2011-07-01 ENCOUNTER — Encounter: Payer: Self-pay | Admitting: Cardiovascular Disease

## 2011-07-01 ENCOUNTER — Ambulatory Visit (INDEPENDENT_AMBULATORY_CARE_PROVIDER_SITE_OTHER): Payer: PRIVATE HEALTH INSURANCE | Admitting: Cardiovascular Disease

## 2011-07-01 VITALS — BP 112/64 | HR 62 | Ht 63.0 in | Wt 143.0 lb

## 2011-07-01 DIAGNOSIS — I10 Essential (primary) hypertension: Secondary | ICD-10-CM

## 2011-07-01 DIAGNOSIS — E785 Hyperlipidemia, unspecified: Secondary | ICD-10-CM

## 2011-07-01 DIAGNOSIS — I739 Peripheral vascular disease, unspecified: Secondary | ICD-10-CM | POA: Insufficient documentation

## 2011-07-01 DIAGNOSIS — F17200 Nicotine dependence, unspecified, uncomplicated: Secondary | ICD-10-CM

## 2011-07-01 DIAGNOSIS — I2510 Atherosclerotic heart disease of native coronary artery without angina pectoris: Secondary | ICD-10-CM

## 2011-07-01 NOTE — Assessment & Plan Note (Signed)
Smoking cessation encouraged!

## 2011-07-01 NOTE — Assessment & Plan Note (Signed)
BP well controlled. No changes.  

## 2011-07-01 NOTE — Assessment & Plan Note (Signed)
Stable. She is doing well. Continue current therapy.

## 2011-07-01 NOTE — Patient Instructions (Signed)
Take samples of Zetia 10mg  daily and let us know how you are doing on it.  Your physician recommends that you return for fasting Lipid Panel in 6 months.  Your physician wants you to follow-up in: 12 months with Dr. Clifton James.  You will receive a reminder letter in the mail two months in advance. If you don't receive a letter, please call our office to schedule the follow-up appointment.

## 2011-07-01 NOTE — Progress Notes (Signed)
History of Present Illness: 56 yo WF with history of CAD, tobacco abuse, PAD, HTN, hyperlipidemia, hypothyroidism here today for follow up. 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 In April 2011 she had stenting of the right common iliac artery. She had a negative stress ECG 5/11. Her cardiac issues had been followed by Dr. Juanda Chance. I last saw her in September 2012. In March 2012, she c/o palpitations and awareness of irregularity of her heart rhythm. There was associated pressure in the chest with the skipped beats. No exertional chest pain or pressure. No SOB. I offered her a Holter monitor but she refused.   She denies any chest pain or SOB. She had back surgery March 15, 2011 and did well. She continues to smoke 1/2 ppd . Overall doing well. She did not tolerate high or low doses of Crestor because of muscle cramps. She had normal CK and LFTs during these cramps. She has been off of Crestor for several  months. She has been having some tingling in her left hand and fingers. Her legs feel great.    Primary Care Physician: Mady Gemma  Last Lipid Profile:  Lipid Panel     Component Value Date/Time   CHOL 207* 02/25/2011 0915   TRIG 107.0 02/25/2011 0915   HDL 65.60 02/25/2011 0915   CHOLHDL 3 02/25/2011 0915   VLDL 21.4 02/25/2011 0915   LDLCALC 44 06/18/2009 0944     Past Medical History  Diagnosis Date  . Hypertension   . Tobacco user   . Overweight   . Thyroid disease     hypothyroid  . Anemia   . PVD (peripheral vascular disease)   . Carotid artery occlusion     inferior stemi    Past Surgical History  Procedure Date  . Back surgery   . Appendectomy     Current Outpatient Prescriptions  Medication Sig Dispense Refill  . ALPRAZolam (XANAX) 0.5 MG tablet Take 0.5 mg by mouth 2 (two) times daily.        Marland Kitchen aspirin 81 MG tablet Take 81 mg by mouth daily.        Marland Kitchen atenolol (TENORMIN) 50 MG tablet Take 50 mg by mouth 2 (two) times daily.         . clopidogrel (PLAVIX) 75 MG tablet TAKE 1 TABLET EVERY DAY WITH A MEAL  30 tablet  8  . Cyanocobalamin (VITAMIN B-12 IJ) Inject as directed every 30 (thirty) days.        . cyclobenzaprine (FLEXERIL) 10 MG tablet Take 10 mg by mouth at bedtime as needed.      Marland Kitchen HYDROcodone-acetaminophen (NORCO) 10-325 MG per tablet Take 1 tablet by mouth every 6 (six) hours as needed.      Marland Kitchen levothyroxine (SYNTHROID, LEVOTHROID) 75 MCG tablet Take 75 mcg by mouth daily.        Marland Kitchen lisinopril (PRINIVIL,ZESTRIL) 20 MG tablet Take 20 mg by mouth daily.        . nitroGLYCERIN (NITROSTAT) 0.4 MG SL tablet Place 1 tablet (0.4 mg total) under the tongue every 5 (five) minutes as needed.  25 tablet  6  . zolpidem (AMBIEN) 5 MG tablet Take 5 mg by mouth at bedtime as needed.          Allergies  Allergen Reactions  . Prednisone Other (See Comments)    Sores in mouth  . Crestor (Rosuvastatin Calcium)   . Codeine Other (See Comments)    "  tears up stomach"    History   Social History  . Marital Status: Married    Spouse Name: N/A    Number of Children: N/A  . Years of Education: N/A   Occupational History  . Not on file.   Social History Main Topics  . Smoking status: Current Everyday Smoker  . Smokeless tobacco: Not on file  . Alcohol Use: No  . Drug Use: No  . Sexually Active:    Other Topics Concern  . Not on file   Social History Narrative  . No narrative on file    Family History  Problem Relation Age of Onset  . Coronary artery disease    . Hypertension Mother   . Diabetes Mother   . Heart disease Father   . Heart attack Sister 40    Review of Systems:  As stated in the HPI and otherwise negative.   BP 112/64  Pulse 62  Ht 5\' 3"  (1.6 m)  Wt 143 lb (64.864 kg)  BMI 25.33 kg/m2  Physical Examination: General: Well developed, well nourished, NAD HEENT: OP clear, mucus membranes moist SKIN: warm, dry. No rashes. Neuro: No focal deficits Musculoskeletal: Muscle strength  5/5 all ext Psychiatric: Mood and affect normal Neck: No JVD, no carotid bruits, no thyromegaly, no lymphadenopathy. Lungs:Clear bilaterally, no wheezes, rhonci, crackles Cardiovascular: Regular rate and rhythm. No murmurs, gallops or rubs. Abdomen:Soft. Bowel sounds present. Non-tender.  Extremities: No lower extremity edema. Pulses are 2 + in the bilateral DP/PT.  EKG: NSR, rate 62 bpm.

## 2011-07-01 NOTE — Assessment & Plan Note (Signed)
Stable No changes 

## 2011-07-01 NOTE — Assessment & Plan Note (Signed)
She does not tolerate statins. Will try Zetia 10 mg po QHS. Samples given today.

## 2012-01-04 ENCOUNTER — Other Ambulatory Visit (INDEPENDENT_AMBULATORY_CARE_PROVIDER_SITE_OTHER): Payer: PRIVATE HEALTH INSURANCE

## 2012-01-04 DIAGNOSIS — I2510 Atherosclerotic heart disease of native coronary artery without angina pectoris: Secondary | ICD-10-CM

## 2012-01-04 LAB — LIPID PANEL
HDL: 54.8 mg/dL (ref >39.00)
Total CHOL/HDL Ratio: 4
Triglycerides: 90 mg/dL (ref 0.0–149.0)

## 2012-01-04 LAB — LDL CHOLESTEROL, DIRECT: Direct LDL: 144.6 mg/dL

## 2012-01-10 ENCOUNTER — Telehealth: Payer: Self-pay | Admitting: Cardiovascular Disease

## 2012-01-10 DIAGNOSIS — E785 Hyperlipidemia, unspecified: Secondary | ICD-10-CM

## 2012-01-10 MED ORDER — EZETIMIBE 10 MG PO TABS: 10.0000 mg | ORAL_TABLET | Freq: Every day | ORAL | Status: DC

## 2012-01-10 NOTE — Telephone Encounter (Signed)
Thanks, cdm 

## 2012-01-10 NOTE — Telephone Encounter (Signed)
Pt returning nurse call, she can be reached at [phone]

## 2012-01-10 NOTE — Telephone Encounter (Signed)
Patient called was told of lipid results.Patient states she did not take Zetia.States she will start taking Zetia 10 mg every night today.Patient was given appointment for lipid and hepatic panels in 3 months 04/11/12.Message fowarded to Dr.McAlhany for review.

## 2012-04-11 ENCOUNTER — Other Ambulatory Visit (INDEPENDENT_AMBULATORY_CARE_PROVIDER_SITE_OTHER): Payer: PRIVATE HEALTH INSURANCE

## 2012-04-11 DIAGNOSIS — E785 Hyperlipidemia, unspecified: Secondary | ICD-10-CM

## 2012-04-11 LAB — HEPATIC FUNCTION PANEL
ALT: 14 U/L (ref 0–35)
AST: 16 U/L (ref 0–37)
Bilirubin, Direct: 0 mg/dL (ref 0.0–0.3)
Total Bilirubin: 0.6 mg/dL (ref 0.3–1.2)
Total Protein: 6.9 g/dL (ref 6.0–8.3)

## 2012-04-11 LAB — LIPID PANEL: Cholesterol: 171 mg/dL (ref 0–200)

## 2012-07-12 ENCOUNTER — Encounter: Payer: Self-pay | Admitting: Cardiovascular Disease

## 2012-07-12 ENCOUNTER — Ambulatory Visit (INDEPENDENT_AMBULATORY_CARE_PROVIDER_SITE_OTHER): Payer: PRIVATE HEALTH INSURANCE | Admitting: Cardiovascular Disease

## 2012-07-12 VITALS — BP 130/70 | HR 60 | Ht 64.0 in | Wt 140.8 lb

## 2012-07-12 DIAGNOSIS — I739 Peripheral vascular disease, unspecified: Secondary | ICD-10-CM

## 2012-07-12 DIAGNOSIS — Z720 Tobacco use: Secondary | ICD-10-CM

## 2012-07-12 DIAGNOSIS — I2510 Atherosclerotic heart disease of native coronary artery without angina pectoris: Secondary | ICD-10-CM

## 2012-07-12 DIAGNOSIS — F17200 Nicotine dependence, unspecified, uncomplicated: Secondary | ICD-10-CM

## 2012-07-12 DIAGNOSIS — I10 Essential (primary) hypertension: Secondary | ICD-10-CM

## 2012-07-12 NOTE — Progress Notes (Signed)
History of Present Illness: 57 yo WF with history of CAD, tobacco abuse, PAD, HTN, hyperlipidemia, hypothyroidism here today for follow up. 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 In April 2011 she had stenting of the right common iliac artery. She had a negative stress ECG 5/11. Her cardiac issues had been followed by Dr. Juanda Chance. I last saw her in September 2012. In March 2012, she c/o palpitations and awareness of irregularity of her heart rhythm. There was associated pressure in the chest with the skipped beats. No exertional chest pain or pressure. No SOB. I offered her a Holter monitor but she refused. She had back surgery March 15, 2011 and did well. She did not tolerate high or low doses of Crestor because of muscle cramps. She had normal CK and LFTs during these cramps. ABI normal 9/12.   She is here today for follow up. She continues to smoke 1/2 ppd . Overall doing well. No leg pains.  No chest pain or SOB.   Primary Care Physician: Mady Gemma   Last Lipid Profile:Lipid Panel     Component Value Date/Time   CHOL 171 04/11/2012 0825   TRIG 145.0 04/11/2012 0825   HDL 55.90 04/11/2012 0825   CHOLHDL 3 04/11/2012 0825   VLDL 29.0 04/11/2012 0825   LDLCALC 86 04/11/2012 0825     Past Medical History  Diagnosis Date  . Hypertension   . Tobacco user   . Overweight   . Thyroid disease     hypothyroid  . Anemia   . PVD (peripheral vascular disease)   . Carotid artery occlusion     inferior stemi    Past Surgical History  Procedure Laterality Date  . Back surgery    . Appendectomy      Current Outpatient Prescriptions  Medication Sig Dispense Refill  . ALPRAZolam (XANAX) 0.5 MG tablet Take 0.5 mg by mouth 2 (two) times daily.        Marland Kitchen aspirin 81 MG tablet Take 81 mg by mouth daily.        Marland Kitchen atenolol (TENORMIN) 50 MG tablet Take 50 mg by mouth 2 (two) times daily.        . clopidogrel (PLAVIX) 75 MG tablet TAKE 1 TABLET EVERY DAY WITH A  MEAL  30 tablet  8  . Cyanocobalamin (VITAMIN B-12 IJ) Inject as directed every 30 (thirty) days.        Marland Kitchen ezetimibe (ZETIA) 10 MG tablet Take 1 tablet (10 mg total) by mouth daily.  30 tablet  6  . levothyroxine (SYNTHROID, LEVOTHROID) 75 MCG tablet Take 75 mcg by mouth daily.        Marland Kitchen lisinopril (PRINIVIL,ZESTRIL) 20 MG tablet Take 20 mg by mouth daily.        . nitroGLYCERIN (NITROSTAT) 0.4 MG SL tablet Place 1 tablet (0.4 mg total) under the tongue every 5 (five) minutes as needed.  25 tablet  6  . zolpidem (AMBIEN) 5 MG tablet Take 5 mg by mouth at bedtime as needed.         No current facility-administered medications for this visit.    Allergies  Allergen Reactions  . Prednisone Other (See Comments)    Sores in mouth  . Crestor (Rosuvastatin Calcium)   . Codeine Other (See Comments)    "tears up stomach"    History   Social History  . Marital Status: Married    Spouse Name: N/A  Number of Children: N/A  . Years of Education: N/A   Occupational History  . Not on file.   Social History Main Topics  . Smoking status: Current Every Day Smoker  . Smokeless tobacco: Not on file  . Alcohol Use: No  . Drug Use: No  . Sexually Active:    Other Topics Concern  . Not on file   Social History Narrative  . No narrative on file    Family History  Problem Relation Age of Onset  . Coronary artery disease    . Hypertension Mother   . Diabetes Mother   . Heart disease Father   . Heart attack Sister 40    Review of Systems:  As stated in the HPI and otherwise negative.   BP 130/70  Pulse 60  Ht 5\' 4"  (1.626 m)  Wt 140 lb 12.8 oz (63.866 kg)  BMI 24.16 kg/m2  Physical Examination: General: Well developed, well nourished, NAD HEENT: OP clear, mucus membranes moist SKIN: warm, dry. No rashes. Neuro: No focal deficits Musculoskeletal: Muscle strength 5/5 all ext Psychiatric: Mood and affect normal Neck: No JVD, no carotid bruits, no thyromegaly, no  lymphadenopathy. Lungs:Clear bilaterally, no wheezes, rhonci, crackles Cardiovascular: Regular rate and rhythm. No murmurs, gallops or rubs. Abdomen:Soft. Bowel sounds present. Non-tender.  Extremities: No lower extremity edema. Pulses are 2 + in the bilateral DP/PT.  EKG:  Assessment and Plan:   1. PAD: Stable. She is doing well. Continue current therapy. Repeat ABI this summer.   2. CAD: Stable. No changes.     3. HYPERLIPIDEMIA: She does not tolerate statins. She also did not tolerate Zetia.   4. HYPERTENSION: BP well controlled. No changes.     5. TOBACCO ABUSE:  Smoking cessation encouraged.

## 2012-07-12 NOTE — Patient Instructions (Addendum)
Your physician wants you to follow-up in:  12 months. You will receive a reminder letter in the mail two months in advance. If you don't receive a letter, please call our office to schedule the follow-up appointment.  Your physician has requested that you have an ankle brachial index (ABI). During this test an ultrasound and blood pressure cuff are used to evaluate the arteries that supply the arms and legs with blood. Allow thirty minutes for this exam. There are no restrictions or special instructions. To be done this summer--June or July

## 2012-09-18 ENCOUNTER — Encounter (INDEPENDENT_AMBULATORY_CARE_PROVIDER_SITE_OTHER): Payer: PRIVATE HEALTH INSURANCE

## 2012-09-18 DIAGNOSIS — I739 Peripheral vascular disease, unspecified: Secondary | ICD-10-CM

## 2012-11-27 ENCOUNTER — Other Ambulatory Visit: Payer: Self-pay | Admitting: Cardiovascular Disease

## 2013-10-09 ENCOUNTER — Ambulatory Visit (INDEPENDENT_AMBULATORY_CARE_PROVIDER_SITE_OTHER): Payer: PRIVATE HEALTH INSURANCE | Admitting: Cardiovascular Disease

## 2013-10-09 ENCOUNTER — Encounter: Payer: Self-pay | Admitting: Cardiovascular Disease

## 2013-10-09 VITALS — BP 130/76 | HR 57 | Ht 63.0 in | Wt 143.0 lb

## 2013-10-09 DIAGNOSIS — I739 Peripheral vascular disease, unspecified: Secondary | ICD-10-CM

## 2013-10-09 DIAGNOSIS — I10 Essential (primary) hypertension: Secondary | ICD-10-CM

## 2013-10-09 DIAGNOSIS — R001 Bradycardia, unspecified: Secondary | ICD-10-CM

## 2013-10-09 DIAGNOSIS — Z720 Tobacco use: Secondary | ICD-10-CM

## 2013-10-09 DIAGNOSIS — I498 Other specified cardiac arrhythmias: Secondary | ICD-10-CM

## 2013-10-09 DIAGNOSIS — F17200 Nicotine dependence, unspecified, uncomplicated: Secondary | ICD-10-CM

## 2013-10-09 DIAGNOSIS — E785 Hyperlipidemia, unspecified: Secondary | ICD-10-CM

## 2013-10-09 DIAGNOSIS — I2510 Atherosclerotic heart disease of native coronary artery without angina pectoris: Secondary | ICD-10-CM

## 2013-10-09 MED ORDER — NITROGLYCERIN 0.4 MG SL SUBL: 0.4000 mg | SUBLINGUAL_TABLET | SUBLINGUAL | Status: DC | PRN

## 2013-10-09 NOTE — Patient Instructions (Signed)
Your physician wants you to follow-up in:  12 months.  You will receive a reminder letter in the mail two months in advance. If you don't receive a letter, please call our office to schedule the follow-up appointment.   

## 2013-10-09 NOTE — Progress Notes (Signed)
History of Present Illness: 58 yo WF with history of CAD, tobacco abuse, PAD, HTN, hyperlipidemia, hypothyroidism here today for follow up. 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 In April 2011 she had stenting of the right common iliac artery. She had a negative stress ECG 5/11. Her cardiac issues had been followed by Dr. Juanda ChanceBrodie. In March 2012, she c/o palpitations and awareness of irregularity of her heart rhythm. There was associated pressure in the chest with the skipped beats. No exertional chest pain or pressure. No SOB. I offered her a Holter monitor but she refused. She did not tolerate high or low doses of Crestor because of muscle cramps. She had normal CK and LFTs during these cramps. ABI normal July 2014.  She is here today for follow up. She continues to smoke 1/2 ppd . Overall doing well. No leg pains.  No chest pain or SOB. Rare dizziness. No syncope.   Primary Care Physician: Mady GemmaKristen Kaplan   Last Lipid Profile:Lipid Panel     Component Value Date/Time   CHOL 171 04/11/2012 0825   TRIG 145.0 04/11/2012 0825   HDL 55.90 04/11/2012 0825   CHOLHDL 3 04/11/2012 0825   VLDL 29.0 04/11/2012 0825   LDLCALC 86 04/11/2012 0825     Past Medical History  Diagnosis Date  . Hypertension   . Tobacco user   . Overweight(278.02)   . Thyroid disease     hypothyroid  . Anemia   . PVD (peripheral vascular disease)   . Carotid artery occlusion     inferior stemi    Past Surgical History  Procedure Laterality Date  . Back surgery    . Appendectomy      Current Outpatient Prescriptions  Medication Sig Dispense Refill  . ALPRAZolam (XANAX) 0.5 MG tablet Take 0.5 mg by mouth 2 (two) times daily.        Marland Kitchen. aspirin 81 MG tablet Take 81 mg by mouth daily.        Marland Kitchen. atenolol (TENORMIN) 50 MG tablet Take 50 mg by mouth 2 (two) times daily.       . clopidogrel (PLAVIX) 75 MG tablet TAKE 1 TABLET EVERY DAY  30 tablet  11  . Cyanocobalamin (VITAMIN B-12 IJ)  Inject as directed every 30 (thirty) days.        Marland Kitchen. levothyroxine (SYNTHROID, LEVOTHROID) 75 MCG tablet Take 75 mcg by mouth daily.        Marland Kitchen. lisinopril (PRINIVIL,ZESTRIL) 20 MG tablet Take 20 mg by mouth daily.        . nitroGLYCERIN (NITROSTAT) 0.4 MG SL tablet Place 1 tablet (0.4 mg total) under the tongue every 5 (five) minutes as needed.  25 tablet  6  . zolpidem (AMBIEN) 5 MG tablet Take 5 mg by mouth at bedtime as needed.         No current facility-administered medications for this visit.    Allergies  Allergen Reactions  . Prednisone Other (See Comments)    Sores in mouth  . Crestor [Rosuvastatin Calcium]   . Codeine Other (See Comments)    "tears up stomach"    History   Social History  . Marital Status: Married    Spouse Name: N/A    Number of Children: N/A  . Years of Education: N/A   Occupational History  . Not on file.   Social History Main Topics  . Smoking status: Current Every Day Smoker  . Smokeless  tobacco: Not on file  . Alcohol Use: No  . Drug Use: No  . Sexual Activity:    Other Topics Concern  . Not on file   Social History Narrative  . No narrative on file    Family History  Problem Relation Age of Onset  . Coronary artery disease    . Hypertension Mother   . Diabetes Mother   . Heart disease Father   . Heart attack Sister 40    Review of Systems:  As stated in the HPI and otherwise negative.   BP 130/76  Pulse 57  Ht 5\' 3"  (1.6 m)  Wt 143 lb (64.864 kg)  BMI 25.34 kg/m2  Physical Examination: General: Well developed, well nourished, NAD HEENT: OP clear, mucus membranes moist SKIN: warm, dry. No rashes. Neuro: No focal deficits Musculoskeletal: Muscle strength 5/5 all ext Psychiatric: Mood and affect normal Neck: No JVD, no carotid bruits, no thyromegaly, no lymphadenopathy. Lungs:Clear bilaterally, no wheezes, rhonci, crackles Cardiovascular: Regular rate and rhythm. No murmurs, gallops or rubs. Abdomen:Soft. Bowel sounds  present. Non-tender.  Extremities: No lower extremity edema. Pulses are 2 + in the bilateral DP/PT.  EKG: sinus brady, rate 57 bpm. T wave inversions anterolateral leads. Unchanged.   Assessment and Plan:   1. PAD: Stable. She is doing well. Continue current therapy. Repeat ABI 2016 per pr request since she is doing well.   2. CAD: Stable. Will continue ASA 81 mg daily. Will stop Plavix. Continue beta blocker.   3. HYPERLIPIDEMIA: She does not tolerate statins. She also did not tolerate Zetia. She does not wish to repeat lipids at this time.   4. HYPERTENSION: BP well controlled. No changes.     5. TOBACCO ABUSE:  Smoking cessation encouraged.     6. Sinus brady: If she has any dizziness, will call us to let us know and consider lowering dose of atenolol.

## 2014-05-07 ENCOUNTER — Other Ambulatory Visit: Payer: Self-pay | Admitting: Orthopaedic Surgery

## 2014-05-07 DIAGNOSIS — M5416 Radiculopathy, lumbar region: Secondary | ICD-10-CM

## 2014-05-07 DIAGNOSIS — M4716 Other spondylosis with myelopathy, lumbar region: Secondary | ICD-10-CM

## 2014-05-27 ENCOUNTER — Ambulatory Visit
Admission: RE | Admit: 2014-05-27 | Discharge: 2014-05-27 | Disposition: A | Discharge status: Home / Self Care | Payer: PRIVATE HEALTH INSURANCE | Source: Ambulatory Visit | Attending: Orthopaedic Surgery | Admitting: Orthopaedic Surgery

## 2014-05-27 DIAGNOSIS — M5416 Radiculopathy, lumbar region: Secondary | ICD-10-CM

## 2014-05-27 DIAGNOSIS — M4716 Other spondylosis with myelopathy, lumbar region: Secondary | ICD-10-CM

## 2015-04-23 ENCOUNTER — Emergency Department (HOSPITAL_COMMUNITY)
Admission: EM | Admit: 2015-04-23 | Discharge: 2015-04-23 | Disposition: A | Discharge status: Home / Self Care | Payer: PRIVATE HEALTH INSURANCE | Attending: Emergency Medicine | Admitting: Emergency Medicine

## 2015-04-23 ENCOUNTER — Encounter (HOSPITAL_COMMUNITY): Payer: Self-pay

## 2015-04-23 ENCOUNTER — Emergency Department (HOSPITAL_COMMUNITY): Payer: PRIVATE HEALTH INSURANCE

## 2015-04-23 DIAGNOSIS — R109 Unspecified abdominal pain: Secondary | ICD-10-CM | POA: Diagnosis not present

## 2015-04-23 DIAGNOSIS — I2510 Atherosclerotic heart disease of native coronary artery without angina pectoris: Secondary | ICD-10-CM | POA: Diagnosis not present

## 2015-04-23 DIAGNOSIS — E039 Hypothyroidism, unspecified: Secondary | ICD-10-CM | POA: Insufficient documentation

## 2015-04-23 DIAGNOSIS — R197 Diarrhea, unspecified: Secondary | ICD-10-CM | POA: Diagnosis not present

## 2015-04-23 DIAGNOSIS — R55 Syncope and collapse: Secondary | ICD-10-CM | POA: Diagnosis not present

## 2015-04-23 DIAGNOSIS — Z9861 Coronary angioplasty status: Secondary | ICD-10-CM | POA: Insufficient documentation

## 2015-04-23 DIAGNOSIS — I959 Hypotension, unspecified: Secondary | ICD-10-CM | POA: Insufficient documentation

## 2015-04-23 DIAGNOSIS — R110 Nausea: Secondary | ICD-10-CM | POA: Insufficient documentation

## 2015-04-23 DIAGNOSIS — Z79899 Other long term (current) drug therapy: Secondary | ICD-10-CM | POA: Insufficient documentation

## 2015-04-23 DIAGNOSIS — I252 Old myocardial infarction: Secondary | ICD-10-CM | POA: Insufficient documentation

## 2015-04-23 DIAGNOSIS — I10 Essential (primary) hypertension: Secondary | ICD-10-CM | POA: Diagnosis not present

## 2015-04-23 DIAGNOSIS — F17210 Nicotine dependence, cigarettes, uncomplicated: Secondary | ICD-10-CM | POA: Diagnosis not present

## 2015-04-23 DIAGNOSIS — D649 Anemia, unspecified: Secondary | ICD-10-CM | POA: Diagnosis not present

## 2015-04-23 DIAGNOSIS — R531 Weakness: Secondary | ICD-10-CM | POA: Diagnosis present

## 2015-04-23 DIAGNOSIS — M79606 Pain in leg, unspecified: Secondary | ICD-10-CM | POA: Diagnosis not present

## 2015-04-23 HISTORY — DX: Atherosclerotic heart disease of native coronary artery without angina pectoris: I25.10

## 2015-04-23 HISTORY — DX: Acute myocardial infarction, unspecified: I21.9

## 2015-04-23 LAB — URINALYSIS, ROUTINE W REFLEX MICROSCOPIC
Bilirubin Urine: NEGATIVE
Glucose, UA: NEGATIVE mg/dL
Hgb urine dipstick: NEGATIVE
Ketones, ur: NEGATIVE mg/dL
LEUKOCYTES UA: NEGATIVE
NITRITE: NEGATIVE
PH: 6 (ref 5.0–8.0)
Protein, ur: NEGATIVE mg/dL
SPECIFIC GRAVITY, URINE: 1.014 (ref 1.005–1.030)

## 2015-04-23 LAB — COMPREHENSIVE METABOLIC PANEL
ALT: 17 U/L (ref 14–54)
ANION GAP: 12 (ref 5–15)
AST: 26 U/L (ref 15–41)
Albumin: 4.5 g/dL (ref 3.5–5.0)
Alkaline Phosphatase: 93 U/L (ref 38–126)
BUN: 35 mg/dL — ABNORMAL HIGH (ref 6–20)
CHLORIDE: 98 mmol/L — AB (ref 101–111)
CO2: 23 mmol/L (ref 22–32)
CREATININE: 1.6 mg/dL — AB (ref 0.44–1.00)
Calcium: 9 mg/dL (ref 8.9–10.3)
GFR, EST AFRICAN AMERICAN: 39 mL/min — AB (ref >60)
GFR, EST NON AFRICAN AMERICAN: 34 mL/min — AB (ref >60)
Glucose, Bld: 102 mg/dL — ABNORMAL HIGH (ref 65–99)
POTASSIUM: 4.4 mmol/L (ref 3.5–5.1)
SODIUM: 133 mmol/L — AB (ref 135–145)
Total Bilirubin: 0.4 mg/dL (ref 0.3–1.2)
Total Protein: 7.7 g/dL (ref 6.5–8.1)

## 2015-04-23 LAB — CBC WITH DIFFERENTIAL/PLATELET
BASOS ABS: 0 10*3/uL (ref 0.0–0.1)
BASOS PCT: 0 %
Eosinophils Absolute: 0.1 10*3/uL (ref 0.0–0.7)
Eosinophils Relative: 0 %
HEMATOCRIT: 41.7 % (ref 36.0–46.0)
Hemoglobin: 13.8 g/dL (ref 12.0–15.0)
LYMPHS PCT: 21 %
Lymphs Abs: 3.5 10*3/uL (ref 0.7–4.0)
MCH: 30.1 pg (ref 26.0–34.0)
MCHC: 33.1 g/dL (ref 30.0–36.0)
MCV: 91 fL (ref 78.0–100.0)
MONO ABS: 0.8 10*3/uL (ref 0.1–1.0)
Monocytes Relative: 5 %
NEUTROS ABS: 11.9 10*3/uL — AB (ref 1.7–7.7)
NEUTROS PCT: 74 %
Platelets: 268 10*3/uL (ref 150–400)
RBC: 4.58 MIL/uL (ref 3.87–5.11)
RDW: 14.1 % (ref 11.5–15.5)
WBC: 16.3 10*3/uL — AB (ref 4.0–10.5)

## 2015-04-23 LAB — I-STAT TROPONIN, ED: TROPONIN I, POC: 0.03 ng/mL (ref 0.00–0.08)

## 2015-04-23 LAB — LIPASE, BLOOD: LIPASE: 34 U/L (ref 11–51)

## 2015-04-23 MED ORDER — SODIUM CHLORIDE 0.9 % IV BOLUS (SEPSIS)
2000.0000 mL | Freq: Once | INTRAVENOUS | Status: AC
  Administered 2015-04-23: 2000 mL via INTRAVENOUS

## 2015-04-23 NOTE — ED Notes (Signed)
Pt c/o hypotension, severe abdominal cramping, chest discomfort, and L leg pain x 2 episodes in 2 weeks.  Pain score 8/10.  Pt reports sudden episodes have lasted around 3 hours.  Sts fatigue after episodes.  Sts both great toes "turned blue" during the episodes, but eventually return to normal.  Hx of MI.  Pt reports similar symptoms previously when she "had a blockage in the main artery that goes to the legs."

## 2015-04-23 NOTE — Discharge Instructions (Signed)
Drink plenty of fluids to stay hydrated. Stop taking your Ultram. Make sure you keep 3 meals a day. Call your cardiologist for follow-up next week return here if anymore problems

## 2015-04-23 NOTE — ED Provider Notes (Signed)
CSN: [id number]     Arrival date & time 04/23/15  1546 History   First MD Initiated Contact with Patient 04/23/15 1616     Chief Complaint  Patient presents with  . Hypotension  . Abdominal Pain  . Leg Pain     (Consider location/radiation/quality/duration/timing/severity/associated sxs/prior Treatment) Patient is a 60 y.o. female presenting with weakness. The history is provided by the patient (Patient states she had an episode where she felt weak and dizzy she started having some abdominal cramps and diarrhea. Patient states that she felt bad for about 3 hours. She also states that she is recently getting over a sinus infection).  Weakness This is a recurrent problem. The current episode started 3 to 5 hours ago. The problem occurs rarely. The problem has been resolved. Associated symptoms include abdominal pain. Pertinent negatives include no chest pain and no headaches. Nothing aggravates the symptoms. Nothing relieves the symptoms.    Past Medical History  Diagnosis Date  . Hypertension   . Tobacco user   . Overweight(278.02)   . Thyroid disease     hypothyroid  . Anemia   . PVD (peripheral vascular disease) (HCC)   . Carotid artery occlusion     inferior stemi  . CAD (coronary artery disease)   . MI (myocardial infarction) [facility])    Past Surgical History  Procedure Laterality Date  . Back surgery    . Appendectomy    . Coronary angioplasty with stent placement    . Plantar fascia surgery    . Abdominal hysterectomy     Family History  Problem Relation Age of Onset  . Coronary artery disease    . Hypertension Mother   . Diabetes Mother   . Heart disease Father   . Heart attack Sister 3   Social History  Substance Use Topics  . Smoking status: Current Every Day Smoker -- 0.50 packs/day    Types: Cigarettes  . Smokeless tobacco: None  . Alcohol Use: No   OB History    No data available     Review of Systems  Constitutional: Negative for appetite change and  fatigue.  HENT: Negative for congestion, ear discharge and sinus pressure.   Eyes: Negative for discharge.  Respiratory: Negative for cough.   Cardiovascular: Negative for chest pain.  Gastrointestinal: Positive for nausea and abdominal pain. Negative for diarrhea.  Genitourinary: Negative for frequency and hematuria.  Musculoskeletal: Negative for back pain.  Skin: Negative for rash.  Neurological: Positive for weakness. Negative for seizures and headaches.  Psychiatric/Behavioral: Negative for hallucinations.      Allergies  Prednisone; Crestor; and Codeine  Home Medications   Prior to Admission medications   Medication Sig Start Date End Date Taking? Authorizing Provider  ALPRAZolam Prudy Feeler) 0.5 MG tablet Take 0.5 mg by mouth 2 (two) times daily.     Yes Historical Provider, MD  atenolol (TENORMIN) 50 MG tablet Take 50 mg by mouth daily.    Yes Historical Provider, MD  Cyanocobalamin (VITAMIN B-12 IJ) Inject as directed every 30 (thirty) days.     Yes Historical Provider, MD  cyclobenzaprine (FLEXERIL) 10 MG tablet Take 10 mg by mouth at bedtime.   Yes Historical Provider, MD  gabapentin (NEURONTIN) 300 MG capsule Take 300 mg by mouth at bedtime.   Yes Historical Provider, MD  levothyroxine (SYNTHROID, LEVOTHROID) 88 MCG tablet Take 88 mcg by mouth daily before breakfast.  03/28/15  Yes Historical Provider, MD  lisinopril (PRINIVIL,ZESTRIL) 20 MG tablet Take 20  mg by mouth daily.     Yes Historical Provider, MD  traMADol (ULTRAM-ER) 300 MG 24 hr tablet Take 300 mg by mouth at bedtime.   Yes Historical Provider, MD  Vitamin D, Ergocalciferol, (DRISDOL) 50000 units CAPS capsule Take 50,000 Units by mouth every 7 (seven) days. Friday.   Yes Historical Provider, MD  zolpidem (AMBIEN) 5 MG tablet Take 5 mg by mouth at bedtime.    Yes Historical Provider, MD  clopidogrel (PLAVIX) 75 MG tablet TAKE 1 TABLET EVERY DAY Patient not taking: Reported on 04/23/2015 11/27/12   Laurey Morale, MD   nitroGLYCERIN (NITROSTAT) 0.4 MG SL tablet Place 1 tablet (0.4 mg total) under the tongue every 5 (five) minutes as needed. 10/09/13   Kathleene Hazel, MD   BP 151/85 mmHg  Pulse 71  Temp(Src) 98 F (36.7 C) (Oral)  Resp 14  SpO2 99% Physical Exam  Constitutional: She is oriented to person, place, and time. She appears well-developed.  HENT:  Head: Normocephalic.  Eyes: Conjunctivae and EOM are normal. No scleral icterus.  Neck: Neck supple. No thyromegaly present.  Cardiovascular: Normal rate and regular rhythm.  Exam reveals no gallop and no friction rub.   No murmur heard. Pulmonary/Chest: No stridor. She has no wheezes. She has no rales. She exhibits no tenderness.  Abdominal: She exhibits no distension. There is no tenderness. There is no rebound.  Musculoskeletal: Normal range of motion. She exhibits no edema.  2+ pulses in all extremities  Lymphadenopathy:    She has no cervical adenopathy.  Neurological: She is oriented to person, place, and time. She exhibits normal muscle tone. Coordination normal.  Skin: No rash noted. No erythema.  Psychiatric: She has a normal mood and affect. Her behavior is normal.    ED Course  Procedures (including critical care time) Labs Review Labs Reviewed  COMPREHENSIVE METABOLIC PANEL - Abnormal; Notable for the following:    Sodium 133 (*)    Chloride 98 (*)    Glucose, Bld 102 (*)    BUN 35 (*)    Creatinine, Ser 1.60 (*)    GFR calc non Af Amer 34 (*)    GFR calc Af Amer 39 (*)    All other components within normal limits  URINALYSIS, ROUTINE W REFLEX MICROSCOPIC (NOT AT [facility]) - Abnormal; Notable for the following:    APPearance CLOUDY (*)    All other components within normal limits  CBC WITH DIFFERENTIAL/PLATELET - Abnormal; Notable for the following:    WBC 16.3 (*)    Neutro Abs 11.9 (*)    All other components within normal limits  LIPASE, BLOOD  I-STAT TROPOININ, ED    Imaging Review Dg Chest 2  View  04/23/2015  CLINICAL DATA:  Shortness of breath, generalized body weakness EXAM: CHEST  2 VIEW COMPARISON:  None. FINDINGS: The heart size and mediastinal contours are within normal limits. Both lungs are clear. The visualized skeletal structures are unremarkable. IMPRESSION: No active cardiopulmonary disease. Electronically Signed   By: Elige Ko   On: 04/23/2015 16:23   I have personally reviewed and evaluated these images and lab results as part of my medical decision-making.   EKG Interpretation   Date/Time:  Wednesday April 23 2015 15:52:02 EST Ventricular Rate:  62 PR Interval:  153 QRS Duration: 95 QT Interval:  434 QTC Calculation: 441 R Axis:   -44 Text Interpretation:  Sinus rhythm Left axis deviation Abnormal R-wave  progression, late transition Nonspecific T abnrm, anterolateral  leads  Baseline wander in lead(s) III Confirmed by Webster Patrone  MD, Khrystyna Schwalm [phone]) on  04/23/2015 4:38:28 PM      MDM   Final diagnoses:  Near syncope    Near syncopal episode. Labs are remarkable for dehydration and elevated BUN and creatinine and elevated white blood count. Suspect the white blood count elevation secondary to signs of infection. Patient has RA take antibiotics for this she has been hydrated with normal saline is not orthostatic have instructed patient to stay hydrated either at least 3 meals a day. Taking her Ultram and follow-up with her cardiologist within a week return if problems    Bethann Berkshire, MD 04/23/15 [id number]

## 2015-04-23 NOTE — ED Notes (Signed)
RN DRAWING LABS 

## 2015-05-06 ENCOUNTER — Ambulatory Visit (INDEPENDENT_AMBULATORY_CARE_PROVIDER_SITE_OTHER): Payer: PRIVATE HEALTH INSURANCE | Admitting: Cardiology

## 2015-05-06 ENCOUNTER — Encounter: Payer: Self-pay | Admitting: Cardiology

## 2015-05-06 VITALS — BP 116/72 | HR 64 | Ht 63.0 in | Wt 137.0 lb

## 2015-05-06 DIAGNOSIS — I2510 Atherosclerotic heart disease of native coronary artery without angina pectoris: Secondary | ICD-10-CM

## 2015-05-06 DIAGNOSIS — I739 Peripheral vascular disease, unspecified: Secondary | ICD-10-CM

## 2015-05-06 MED ORDER — NITROGLYCERIN 0.4 MG SL SUBL: 0.4000 mg | SUBLINGUAL_TABLET | SUBLINGUAL | Status: DC | PRN

## 2015-05-06 MED ORDER — ASPIRIN EC 81 MG PO TBEC: 81.0000 mg | DELAYED_RELEASE_TABLET | Freq: Every day | ORAL | Status: DC

## 2015-05-06 NOTE — Addendum Note (Signed)
Addended by: Baird Lyons on: 05/06/2015 04:08 PM   Modules accepted: Orders

## 2015-05-06 NOTE — Progress Notes (Signed)
05/06/2015 Lisa [name]   1955-03-26  [id number]  Primary Physician Lisa Gemma, PA-C Primary Cardiologist: Dr. Clifton [name]   Reason for Visit/CC: Post ED Visit for near Syncope; F/u for CAD and PVD  HPI:  60 yo WF with history of CAD, tobacco abuse, PAD, HTN, hyperlipidemia, hypothyroidism here today for follow up. 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 In April 2011 she had stenting of the right common iliac artery. Last doppler study in 2014 showed normal ABIs. She had a negative stress ECG 5/11. She had issues with statin tolerance in the past, with Crestor. She refusses to restart statin therapy. She also self discontinued ASA and Plavix over a year ago due to bruising. Her cardiac issues had been followed by Dr. Juanda [name]. She established care with Dr. Clifton [name] after he retired but has not been seen since 09/2013.   She was recently seen in the [facility] [place] ED 04/23/15 for near syncope. This occurred in the setting of abdominal pain and diarrhea. Per notes, she was hypotensive with SBP in the 80s. Labs were remarkable for dehydration and elevated BUN and creatinine. She was treated with IVFs, released from the hospital and instructed to remain hydrated at home. The patient states that she was also on extended released Ultram for chornic back pain, which is what she felt caused her hypotension. Since then she discontinued Ultram and has not had any recurrent near syncope and no further hypotension.   She denies any chest pain. No dyspnea. No exertional symptoms. She does complain of discoloration of both her left and right great toe. She also notes occasional numbness and tingling. No resting leg pain. As mentioned above, she has been noncompliant with recommendations. She has continued to smoke. She refuses to take statins due to intolerances. She also has been noncompliant with ASA and Plavix.     Current Outpatient Prescriptions  Medication Sig Dispense Refill  .  ALPRAZolam (XANAX) 0.5 MG tablet Take 0.5 mg by mouth 2 (two) times daily.      Marland Kitchen atenolol (TENORMIN) 50 MG tablet Take 50 mg by mouth 2 (two) times daily.    . Cyanocobalamin (VITAMIN B-12 IJ) Inject as directed every 30 (thirty) days.      . cyclobenzaprine (FLEXERIL) 10 MG tablet Take 10 mg by mouth at bedtime.    . gabapentin (NEURONTIN) 300 MG capsule Take 300 mg by mouth at bedtime.    Marland Kitchen levothyroxine (SYNTHROID, LEVOTHROID) 88 MCG tablet Take 88 mcg by mouth daily before breakfast.     . lisinopril (PRINIVIL,ZESTRIL) 20 MG tablet Take 20 mg by mouth daily.      . nitroGLYCERIN (NITROSTAT) 0.4 MG SL tablet Place 1 tablet (0.4 mg total) under the tongue every 5 (five) minutes as needed. 25 tablet 6  . Vitamin D, Ergocalciferol, (DRISDOL) 50000 units CAPS capsule Take 50,000 Units by mouth every 7 (seven) days. Friday.    . zolpidem (AMBIEN) 5 MG tablet Take 5 mg by mouth at bedtime.      No current facility-administered medications for this visit.    Allergies  Allergen Reactions  . Prednisone Other (See Comments)    Sores in mouth  . Crestor [Rosuvastatin Calcium]   . Codeine Other (See Comments)    "tears up stomach"    Social History   Social History  . Marital Status: Married    Spouse Name: N/A  . Number of Children: N/A  . Years of Education: N/A  Occupational History  . Not on file.   Social History Main Topics  . Smoking status: Current Every Day Smoker -- 0.50 packs/day    Types: Cigarettes  . Smokeless tobacco: Not on file  . Alcohol Use: No  . Drug Use: No  . Sexual Activity: Not on file   Other Topics Concern  . Not on file   Social History Narrative     Review of Systems: General: negative for chills, fever, night sweats or weight changes.  Cardiovascular: negative for chest pain, dyspnea on exertion, edema, orthopnea, palpitations, paroxysmal nocturnal dyspnea or shortness of breath Dermatological: negative for rash Respiratory: negative for  cough or wheezing Urologic: negative for hematuria Abdominal: negative for nausea, vomiting, diarrhea, bright red blood per rectum, melena, or hematemesis Neurologic: negative for visual changes, syncope, or dizziness All other systems reviewed and are otherwise negative except as noted above.    Blood pressure 116/72, pulse 64, height 5\' 3"  (1.6 m), weight 137 lb (62.143 kg).  General appearance: alert, cooperative, no distress and appears older than age Neck: no carotid bruit and no JVD Lungs: clear to auscultation bilaterally Heart: regular rate and rhythm, S1, S2 normal, no murmur, click, rub or gallop Extremities: cyanotic right and left great toe, no pedal ulcers. No LEE Pulses: 2+ radials, decressed distal pulses bilaterally Skin: Skin color, texture, turgor normal. No rashes or lesions Neurologic: Grossly normal  EKG ED EKG from 04/25/15 reviewed. NSR. TWI in V1-V2 unchanged from prior EKGs  ASSESSMENT AND PLAN:   1. CAD: s/p inferior MI with a bare-metal stent to the RCA in 2007. She denies any anginal symptoms. No exertional CP or dyspnea. Recent EKG in ED showed NSR with TWIs in V1-V2, unchanged from previous. Continue BB and ACE-I. Patient advised to restart ASA. She refuses to retry statins.  2. PVD: s/p right common iliac artery stenting in 2011. Normal ABIs in 2014. She notes discolored great toe on the right and left. No ulcers. No resting leg pain. She is a smoker and has been noncompliant with DAPT. We will obtain repeat lower extremity arterial dopplers with ABIs at [facility]. Will arrange consultation with Dr. Allyson [name]. Patient instructed to start ASA daily.   3. Hypotension: patient attributes to ER ultram use. She has since discontinued with no further recurrence. BP is stable.   4. HLD: intolerant to statins.   5. Tobacco Abuse: smoking cessation strongly advised.   PLAN  Bilateral LE arterial dopplers + referral to Dr. Allyson [name] for PVD. F/u with Dr. Clifton [name] for CAD  in 6 months.   Lisa Lis PA-C 05/06/2015 3:36 PM

## 2015-05-06 NOTE — Patient Instructions (Addendum)
Medication Instructions:  Your physician has recommended you make the following change in your medication:  1) RESTART Aspirin 81 mg daily  Labwork: None today  Testing/Procedures: Your physician recommends that you have lower extremity dopplers.  Follow-Up: You have been referred to Dr. Allyson Sabal (f/u after dopplers)  Your physician wants you to follow-up in: 6 months with Dr. Clifton James. You will receive a reminder letter in the mail two months in advance. If you don't receive a letter, please call our office to schedule the follow-up appointment.  If you need a refill on your cardiac medications before your next appointment, please call your pharmacy.  Thank you for choosing [facility]!!

## 2015-05-07 ENCOUNTER — Other Ambulatory Visit: Payer: Self-pay | Admitting: Cardiology

## 2015-05-07 DIAGNOSIS — I739 Peripheral vascular disease, unspecified: Secondary | ICD-10-CM

## 2015-05-08 ENCOUNTER — Other Ambulatory Visit: Payer: Self-pay | Admitting: Cardiology

## 2015-05-08 DIAGNOSIS — I739 Peripheral vascular disease, unspecified: Secondary | ICD-10-CM

## 2015-05-09 ENCOUNTER — Ambulatory Visit (HOSPITAL_COMMUNITY)
Admission: RE | Admit: 2015-05-09 | Discharge: 2015-05-09 | Disposition: A | Discharge status: Home / Self Care | Payer: PRIVATE HEALTH INSURANCE | Source: Ambulatory Visit | Attending: Cardiology | Admitting: Cardiology

## 2015-05-09 DIAGNOSIS — I700 Atherosclerosis of aorta: Secondary | ICD-10-CM | POA: Insufficient documentation

## 2015-05-09 DIAGNOSIS — I739 Peripheral vascular disease, unspecified: Secondary | ICD-10-CM | POA: Insufficient documentation

## 2015-05-09 DIAGNOSIS — I708 Atherosclerosis of other arteries: Secondary | ICD-10-CM | POA: Diagnosis not present

## 2015-05-09 DIAGNOSIS — Z720 Tobacco use: Secondary | ICD-10-CM | POA: Insufficient documentation

## 2015-05-09 DIAGNOSIS — I10 Essential (primary) hypertension: Secondary | ICD-10-CM | POA: Insufficient documentation

## 2015-05-27 ENCOUNTER — Encounter: Payer: PRIVATE HEALTH INSURANCE | Admitting: Cardiovascular Disease

## 2015-05-28 ENCOUNTER — Telehealth: Payer: Self-pay | Admitting: *Deleted

## 2015-05-28 DIAGNOSIS — I2510 Atherosclerotic heart disease of native coronary artery without angina pectoris: Secondary | ICD-10-CM

## 2015-05-28 NOTE — Telephone Encounter (Signed)
-----   Message from Alvin, [place] sent at 05/23/2015  5:01 PM EST ----- Lower extremity ultrasound did not indicate blood flow abnormalities Recommendations are to repeat study in 1 year.

## 2016-03-12 ENCOUNTER — Other Ambulatory Visit: Payer: Self-pay | Admitting: Orthopaedic Surgery

## 2016-03-12 DIAGNOSIS — M4716 Other spondylosis with myelopathy, lumbar region: Secondary | ICD-10-CM

## 2016-03-12 DIAGNOSIS — M5416 Radiculopathy, lumbar region: Secondary | ICD-10-CM

## 2016-03-19 ENCOUNTER — Ambulatory Visit
Admission: RE | Admit: 2016-03-19 | Discharge: 2016-03-19 | Disposition: A | Discharge status: Home / Self Care | Payer: PRIVATE HEALTH INSURANCE | Source: Ambulatory Visit | Attending: Orthopaedic Surgery | Admitting: Orthopaedic Surgery

## 2016-03-19 DIAGNOSIS — M5416 Radiculopathy, lumbar region: Secondary | ICD-10-CM

## 2016-03-19 DIAGNOSIS — M4716 Other spondylosis with myelopathy, lumbar region: Secondary | ICD-10-CM

## 2016-04-06 ENCOUNTER — Telehealth: Payer: Self-pay | Admitting: Cardiovascular Disease

## 2016-04-06 NOTE — Telephone Encounter (Signed)
Patient called and is scheduled for back surgery and would like to know if we have received paperwork for surgical  Clearance. Patient would also like to know if she needs to schedule an appt in order to get clearance. Please call, thanks.

## 2016-04-07 NOTE — Telephone Encounter (Signed)
Follow Up   Patient called and stated she is for back surgery and would like to know if we have received paperwork for surgical  clearance. Wants to know if she needs to schedule an appt in order to get clearance.

## 2016-04-07 NOTE — Telephone Encounter (Signed)
Called patient to let her know clearance request is in Dr. Gibson Ramp box. He will be in the office in the morning. Will forward message to his nurse. Informed patient that someone would get back to her if she needs office visit or testing.

## 2016-04-08 NOTE — Telephone Encounter (Signed)
Pt will need office visit. I placed call to pt to schedule this appointment and left message to call back

## 2016-04-08 NOTE — Telephone Encounter (Signed)
Pt has been scheduled to see Dr. Clifton James tomorrow at 8:15.

## 2016-04-09 ENCOUNTER — Ambulatory Visit (INDEPENDENT_AMBULATORY_CARE_PROVIDER_SITE_OTHER): Payer: PRIVATE HEALTH INSURANCE | Admitting: Cardiovascular Disease

## 2016-04-09 ENCOUNTER — Encounter: Payer: Self-pay | Admitting: Cardiovascular Disease

## 2016-04-09 ENCOUNTER — Ambulatory Visit (HOSPITAL_COMMUNITY): Payer: PRIVATE HEALTH INSURANCE | Attending: Cardiovascular Disease

## 2016-04-09 ENCOUNTER — Encounter (INDEPENDENT_AMBULATORY_CARE_PROVIDER_SITE_OTHER): Payer: Self-pay

## 2016-04-09 ENCOUNTER — Other Ambulatory Visit: Payer: Self-pay

## 2016-04-09 VITALS — BP 120/72 | HR 53 | Ht 64.0 in | Wt 146.6 lb

## 2016-04-09 DIAGNOSIS — I2510 Atherosclerotic heart disease of native coronary artery without angina pectoris: Secondary | ICD-10-CM

## 2016-04-09 DIAGNOSIS — Z01810 Encounter for preprocedural cardiovascular examination: Secondary | ICD-10-CM | POA: Diagnosis not present

## 2016-04-09 DIAGNOSIS — Z720 Tobacco use: Secondary | ICD-10-CM

## 2016-04-09 DIAGNOSIS — I10 Essential (primary) hypertension: Secondary | ICD-10-CM | POA: Diagnosis not present

## 2016-04-09 DIAGNOSIS — E7800 Pure hypercholesterolemia, unspecified: Secondary | ICD-10-CM | POA: Diagnosis not present

## 2016-04-09 DIAGNOSIS — I501 Left ventricular failure: Secondary | ICD-10-CM | POA: Diagnosis not present

## 2016-04-09 DIAGNOSIS — I739 Peripheral vascular disease, unspecified: Secondary | ICD-10-CM

## 2016-04-09 LAB — ECHOCARDIOGRAM COMPLETE
AOASC: 32 cm
AVLVOTPG: 8 mmHg
E decel time: 216 msec
E/e' ratio: 10.99
FS: 42 % (ref 28–44)
Height: 64 in
IVS/LV PW RATIO, ED: 0.93
LA ID, A-P, ES: 31 mm
LA diam end sys: 31 mm
LA diam index: 1.81 cm/m2
LAVOL: 29.1 mL
LAVOLA4C: 29.8 mL
LAVOLIN: 17 mL/m2
LDCA: 3.14 cm2
LV E/e'average: 10.99
LV TDI E'MEDIAL: 5.87
LV e' LATERAL: 8.16 cm/s
LVEEMED: 10.99
LVOT SV: 107 mL
LVOT VTI: 34.2 cm
LVOT peak vel: 137 cm/s
LVOTD: 20 mm
Lateral S' vel: 8.81 cm/s
MV Dec: 216
MV pk E vel: 89.7 m/s
MVPG: 3 mmHg
MVPKAVEL: 67.9 m/s
PW: 8.13 mm — AB (ref 0.6–1.1)
RV TAPSE: 17.4 mm
TDI e' lateral: 8.16
Weight: 2345.6 oz

## 2016-04-09 MED ORDER — NITROGLYCERIN 0.4 MG SL SUBL: 0.4000 mg | SUBLINGUAL_TABLET | SUBLINGUAL | 6 refills | Status: AC | PRN

## 2016-04-09 MED ORDER — ASPIRIN EC 81 MG PO TBEC: 81.0000 mg | DELAYED_RELEASE_TABLET | Freq: Every day | ORAL | 3 refills | Status: AC

## 2016-04-09 NOTE — Progress Notes (Signed)
Chief Complaint  Patient presents with  . Follow-up    History of Present Illness: 61 yo WF with history of CAD, tobacco abuse, PAD, HTN, hyperlipidemia, hypothyroidism here today for follow up. In June 2007 she had an inferior MI with a bare-metal stent placed in the RCA. In April 2011 she had stenting of the right common iliac artery. She did not tolerate Crestor because of muscle cramps.  ABI normal February 2017.   She is here today for follow up. She continues to smoke 1/2 ppd. She is trying to quit smoking. Overall doing well. No leg pains.  No chest pain or SOB. No syncope or near syncope. Only c/o back pain.   Primary Care Physician: Lilia Argue   Past Medical History:  Diagnosis Date  . Anemia   . CAD (coronary artery disease)   . Carotid artery occlusion    inferior stemi  . Hypertension   . MI (myocardial infarction)   . Overweight(278.02)   . PVD (peripheral vascular disease) (HCC)   . Thyroid disease    hypothyroid  . Tobacco user     Past Surgical History:  Procedure Laterality Date  . ABDOMINAL HYSTERECTOMY    . APPENDECTOMY    . BACK SURGERY    . CORONARY ANGIOPLASTY WITH STENT PLACEMENT    . PLANTAR FASCIA SURGERY      Current Outpatient Prescriptions  Medication Sig Dispense Refill  . ALPRAZolam (XANAX) 0.5 MG tablet Take 0.5 mg by mouth 2 (two) times daily.      Marland Kitchen atenolol (TENORMIN) 50 MG tablet Take 50 mg by mouth 2 (two) times daily.    . Cyanocobalamin (VITAMIN B-12 IJ) Inject as directed every 30 (thirty) days.      . cyclobenzaprine (FLEXERIL) 10 MG tablet Take 10 mg by mouth 3 (three) times daily.     Marland Kitchen gabapentin (NEURONTIN) 300 MG capsule Take 300 mg by mouth 2 (two) times daily.     Marland Kitchen levothyroxine (SYNTHROID, LEVOTHROID) 88 MCG tablet Take 88 mcg by mouth daily before breakfast.     . lisinopril (PRINIVIL,ZESTRIL) 20 MG tablet Take 20 mg by mouth daily.      . nitroGLYCERIN (NITROSTAT) 0.4 MG SL tablet Place 1 tablet (0.4 mg  total) under the tongue every 5 (five) minutes as needed. 25 tablet 6  . traMADol (ULTRAM) 50 MG tablet Take 50 mg by mouth daily.  0  . zolpidem (AMBIEN) 5 MG tablet Take 5 mg by mouth at bedtime.     Marland Kitchen aspirin EC 81 MG tablet Take 1 tablet (81 mg total) by mouth daily. 90 tablet 3   No current facility-administered medications for this visit.     Allergies  Allergen Reactions  . Prednisone Other (See Comments) and Rash    Sores in mouth Mouth sores Sores in mouth Sores in mouth  . Crestor [Rosuvastatin Calcium]   . Codeine Other (See Comments) and Rash    "tears up stomach" "tears up stomach"    Social History   Social History  . Marital status: Married    Spouse name: N/A  . Number of children: N/A  . Years of education: N/A   Occupational History  . Not on file.   Social History Main Topics  . Smoking status: Current Every Day Smoker    Packs/day: 0.50    Types: Cigarettes  . Smokeless tobacco: Not on file  . Alcohol use No  . Drug use: No  . Sexual activity: Not  on file   Other Topics Concern  . Not on file   Social History Narrative  . No narrative on file    Family History  Problem Relation Age of Onset  . Coronary artery disease    . Hypertension Mother   . Diabetes Mother   . Heart disease Father   . Heart attack Sister 40    Review of Systems:  As stated in the HPI and otherwise negative.   BP 120/72   Pulse (!) 53   Ht 5\' 4"  (1.626 m)   Wt 146 lb 9.6 oz (66.5 kg)   BMI 25.16 kg/m   Physical Examination: General: Well developed, well nourished, NAD  HEENT: OP clear, mucus membranes moist  SKIN: warm, dry. No rashes. Neuro: No focal deficits  Musculoskeletal: Muscle strength 5/5 all ext  Psychiatric: Mood and affect normal  Neck: No JVD, no carotid bruits, no thyromegaly, no lymphadenopathy.  Lungs:Clear bilaterally, no wheezes, rhonci, crackles Cardiovascular: Regular rate and rhythm. No murmurs, gallops or rubs. Abdomen:Soft.  Bowel sounds present. Non-tender.  Extremities: No lower extremity edema. Pulses are trace+ in the bilateral DP/PT.  EKG:  EKG is ordered today. The ekg ordered today demonstrates Sinus brady, rate 53 bpm. T wave inversion lateral leads, unchanged  Recent Labs: 04/23/2015: ALT 17; BUN 35; Creatinine, Ser 1.60; Hemoglobin 13.8; Platelets 268; Potassium 4.4; Sodium 133   Lipid Panel    Component Value Date/Time   CHOL 171 04/11/2012 0825   TRIG 145.0 04/11/2012 0825   HDL 55.90 04/11/2012 0825   CHOLHDL 3 04/11/2012 0825   VLDL 29.0 04/11/2012 0825   LDLCALC 86 04/11/2012 0825   LDLDIRECT 144.6 01/04/2012 0855     Wt Readings from Last 3 Encounters:  04/09/16 146 lb 9.6 oz (66.5 kg)  05/06/15 137 lb (62.1 kg)  10/09/13 143 lb (64.9 kg)     Other studies Reviewed: Additional studies/ records that were reviewed today include: . Review of the above records demonstrates:   Assessment and Plan:   1. PAD: s/p right common iliac artery stent in 2011. Stable by dopplers 2017 and having no worrisome pain. Will repeat ABI next month as planned. Carotid dopplers next month.   2. CAD/Pre-operative cardiovascular examination: No chest pain suggestive of angina. She is mostly limited by back pain. EKG without change. Will arrange echocardiogram today to assess LV function. If her LV function is unchanged, she can proceed with her planned surgical procedure without further cardiac workup. She is adverse to most testing and is not willing to consider stress testing. She has not been taking ASA as advised. I have asked her to restart ASA. Will continue beta blocker.    3. HYPERLIPIDEMIA: She does not tolerate statins. She also did not tolerate Zetia. She does not wish to repeat lipids at this time.   4. HYPERTENSION: BP well controlled. No changes.     5. TOBACCO ABUSE:  Smoking cessation encouraged.     Current medicines are reviewed at length with the patient today.  The patient does not have  concerns regarding medicines.  The following changes have been made:  no change  Labs/ tests ordered today include:   Orders Placed This Encounter  Procedures  . EKG 12-Lead  . ECHOCARDIOGRAM COMPLETE     Disposition:   FU with me in 12 months   Signed, Verne Carrow, MD 04/09/2016 10:09 AM    [facility] [address] [place], [place], [place]  [address] Phone: [phone]; Fax: [phone]

## 2016-04-09 NOTE — Progress Notes (Signed)
2D Echo completed. 04/09/2016

## 2016-04-09 NOTE — Patient Instructions (Signed)
Medication Instructions:  Your physician has recommended you make the following change in your medication:  Start aspirin 81 mg by mouth daily.   Labwork: none  Testing/Procedures: Your physician has requested that you have an echocardiogram. Echocardiography is a painless test that uses sound waves to create images of your heart. It provides your doctor with information about the size and shape of your heart and how well your heart's chambers and valves are working. This procedure takes approximately one hour. There are no restrictions for this procedure. To be done today at 3:00  Follow-Up: Your physician recommends that you schedule a follow-up appointment in: 12 months.  Please call our office in about 9 months to schedule this appointment.     Any Other Special Instructions Will Be Listed Below (If Applicable).     If you need a refill on your cardiac medications before your next appointment, please call your pharmacy.

## 2016-08-05 ENCOUNTER — Other Ambulatory Visit: Payer: Self-pay | Admitting: Orthopaedic Surgery

## 2016-08-05 ENCOUNTER — Ambulatory Visit
Admission: RE | Admit: 2016-08-05 | Discharge: 2016-08-05 | Disposition: A | Discharge status: Home / Self Care | Payer: PRIVATE HEALTH INSURANCE | Source: Ambulatory Visit | Attending: Orthopaedic Surgery | Admitting: Orthopaedic Surgery

## 2016-08-05 DIAGNOSIS — R2242 Localized swelling, mass and lump, left lower limb: Secondary | ICD-10-CM

## 2019-01-03 ENCOUNTER — Other Ambulatory Visit: Payer: Self-pay | Admitting: Orthopaedic Surgery

## 2019-01-03 DIAGNOSIS — M545 Low back pain: Secondary | ICD-10-CM

## 2019-01-03 DIAGNOSIS — G8929 Other chronic pain: Secondary | ICD-10-CM

## 2019-01-03 DIAGNOSIS — M546 Pain in thoracic spine: Secondary | ICD-10-CM

## 2019-01-05 ENCOUNTER — Telehealth: Payer: Self-pay

## 2019-01-05 NOTE — Telephone Encounter (Signed)
Phone call to patient to verify medication list and allergies for myelogram procedure. Pt is currently taking Plavix, which will need to be held prior to this procedure, a clearance has been faxed to the prescribing doctor, awaiting further instructions. Pt was also instructed she would be at our office around 2 hours, to have a driver the day of the procedure, and she would need to lay flat for 24 hours after. Pt verbalized understanding.

## 2019-01-16 NOTE — Discharge Instructions (Signed)
Myelogram Discharge Instructions  1. Go home and rest quietly for the next 24 hours.  It is important to lie flat for the next 24 hours.  Get up only to go to the restroom.  You may lie in the bed or on a couch on your back, your stomach, your left side or your right side.  You may have one pillow under your head.  You may have pillows between your knees while you are on your side or under your knees while you are on your back.  2. DO NOT drive today.  Recline the seat as far back as it will go, while still wearing your seat belt, on the way home.  3. You may get up to go to the bathroom as needed.  You may sit up for 10 minutes to eat.  You may resume your normal diet and medications unless otherwise indicated.  Drink lots of extra fluids today and tomorrow.  4. The incidence of headache, nausea, or vomiting is about 5% (one in 20 patients).  If you develop a headache, lie flat and drink plenty of fluids until the headache goes away.  Caffeinated beverages may be helpful.  If you develop severe nausea and vomiting or a headache that does not go away with flat bed rest, call [phone].  5. You may resume normal activities after your 24 hours of bed rest is over; however, do not exert yourself strongly or do any heavy lifting tomorrow. If when you get up you have a headache when standing, go back to bed and force fluids for another 24 hours.  6. Call your physician for a follow-up appointment.  The results of your myelogram will be sent directly to your physician by the following day.  7. If you have any questions or if complications develop after you arrive home, please call [phone].  Discharge instructions have been explained to the patient.  The patient, or the person responsible for the patient, fully understands these instructions.  YOU MAY RESTART YOUR PLAVIX TODAY. YOU MAY RESTART YOUR TRAMADOL TOMORROW 01/18/2019 AT 09:30AM.

## 2019-01-17 ENCOUNTER — Ambulatory Visit
Admission: RE | Admit: 2019-01-17 | Discharge: 2019-01-17 | Disposition: A | Discharge status: Home / Self Care | Payer: Medicare Other | Source: Ambulatory Visit | Attending: Orthopaedic Surgery | Admitting: Orthopaedic Surgery

## 2019-01-17 ENCOUNTER — Other Ambulatory Visit: Payer: Self-pay

## 2019-01-17 DIAGNOSIS — G8929 Other chronic pain: Secondary | ICD-10-CM

## 2019-01-17 DIAGNOSIS — M545 Low back pain, unspecified: Secondary | ICD-10-CM

## 2019-01-17 DIAGNOSIS — M546 Pain in thoracic spine: Secondary | ICD-10-CM

## 2019-01-17 MED ORDER — DIAZEPAM 5 MG PO TABS: 10.0000 mg | ORAL_TABLET | Freq: Once | ORAL | Status: DC

## 2019-01-17 MED ORDER — IOPAMIDOL (ISOVUE-M 300) INJECTION 61%
10.0000 mL | Freq: Once | INTRAMUSCULAR | Status: AC | PRN
  Administered 2019-01-17: 10 mL via INTRATHECAL

## 2019-01-17 NOTE — Progress Notes (Signed)
Patient states she has been off Plavix for the past five days and Tramadol for the past two days.

## 2021-08-20 IMAGING — XA DG MYELOGRAPHY LUMBAR INJ MULTI REGION
13 of 24 series · 13 of 24 positions shown · non-contrast
Comparison: none

CLINICAL DATA: Thoracic spine pain. Chronic low back pain. Lumbar
spine surgery.
TECHNIQUE: Contiguous axial images were obtained through the Thoracic and
Lumbar spine after the intrathecal infusion of infusion. Coronal and
sagittal reconstructions were obtained of the axial image sets.

[Series 1: w lumbar spine lat · 0.15mm/px · 1 of 1 slices shown]
[im 1/1]
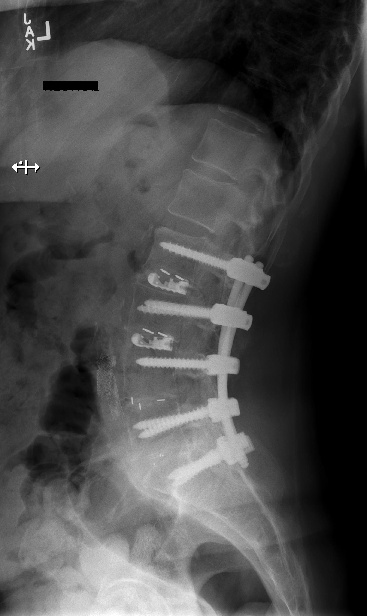

[Series 2: vasc adipose · 1 of 1 slices shown (1 of 11)]
[im 1/1]
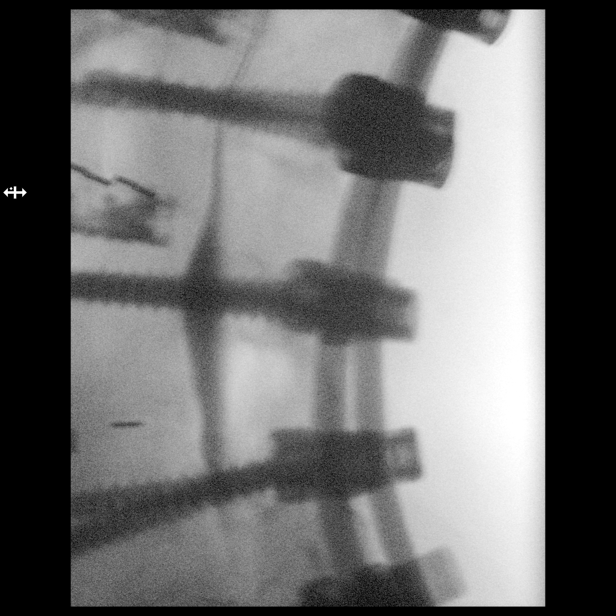

[Series 3: w lumbar spine extension · 0.15mm/px · 1 of 1 slices shown]
[im 1/1]
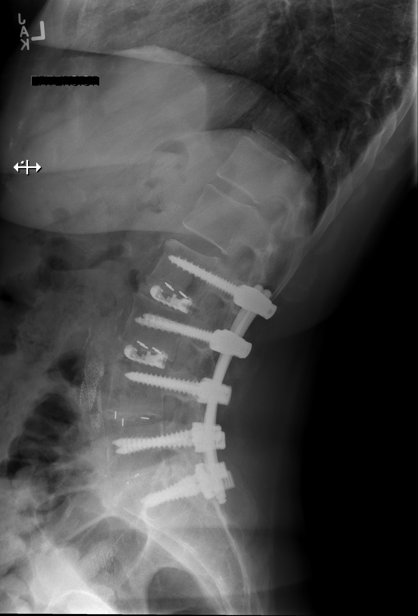

[Series 4: vasc adipose · 1 of 1 slices shown (2 of 11)]
[im 1/1]
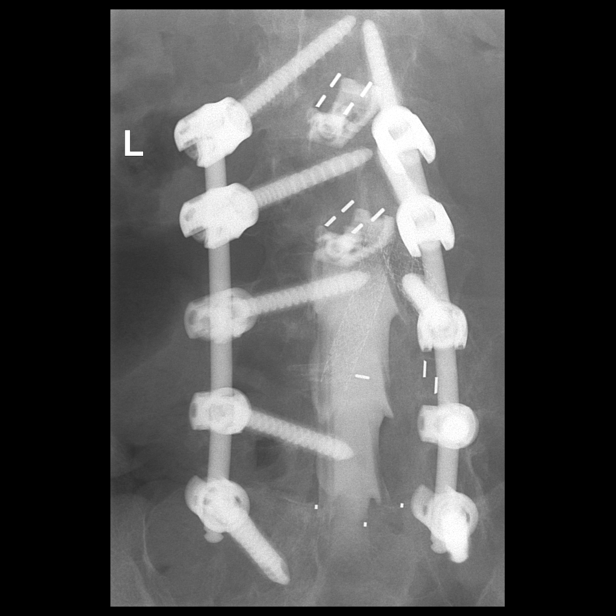

[Series 6: vasc adipose · 1 of 1 slices shown (3 of 11)]
[im 1/1]
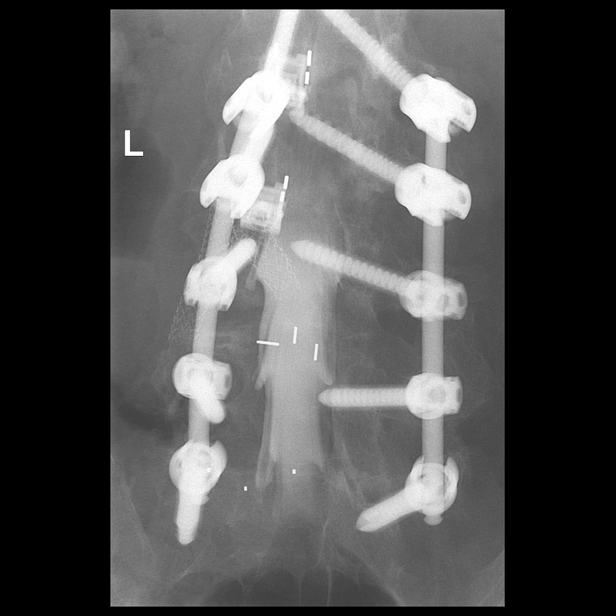

[Series 8: vasc adipose · 1 of 1 slices shown (4 of 11)]
[im 1/1]
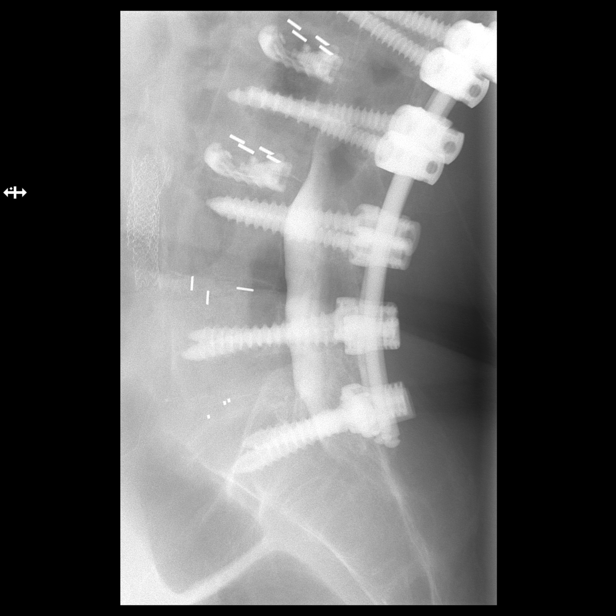

[Series 10: vasc adipose · 1 of 1 slices shown (5 of 11)]
[im 1/1]
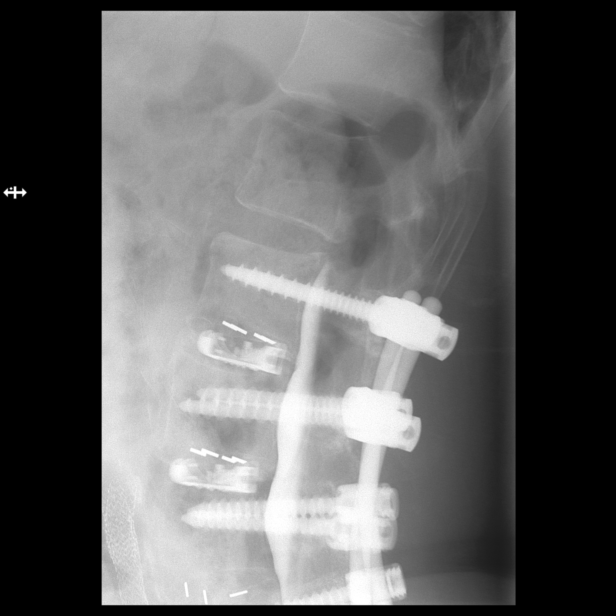

[Series 11: vasc adipose · 1 of 1 slices shown (6 of 11)]
[im 1/1]
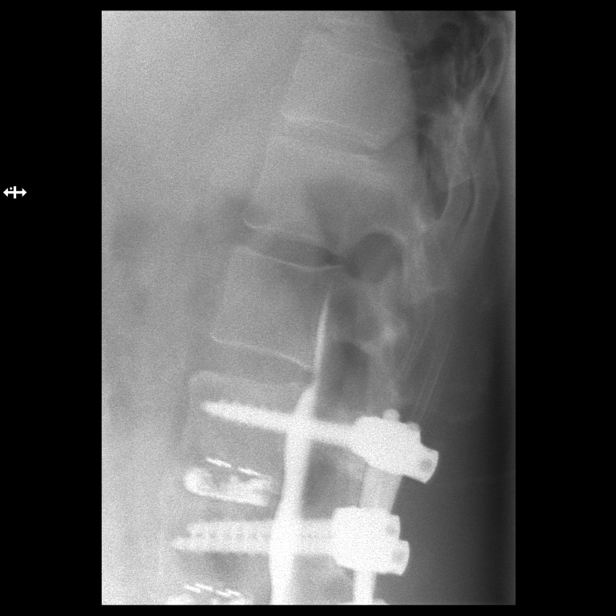

[Series 13: vasc adipose · 1 of 1 slices shown (7 of 11)]
[im 1/1]
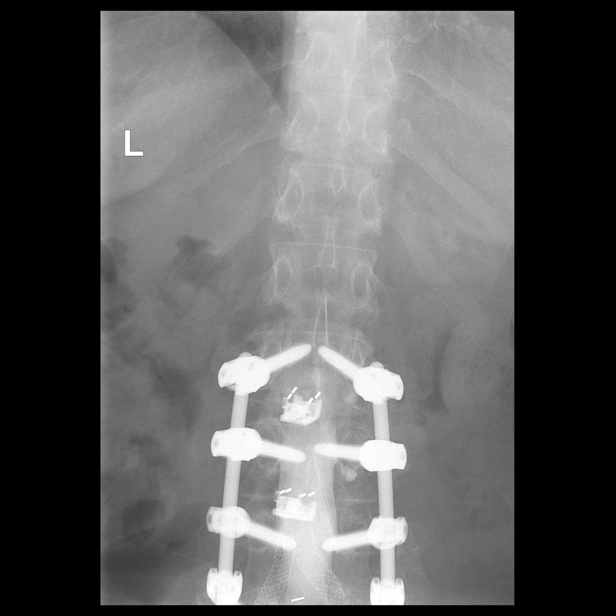

[Series 15: vasc adipose · 1 of 1 slices shown (8 of 11)]
[im 1/1]
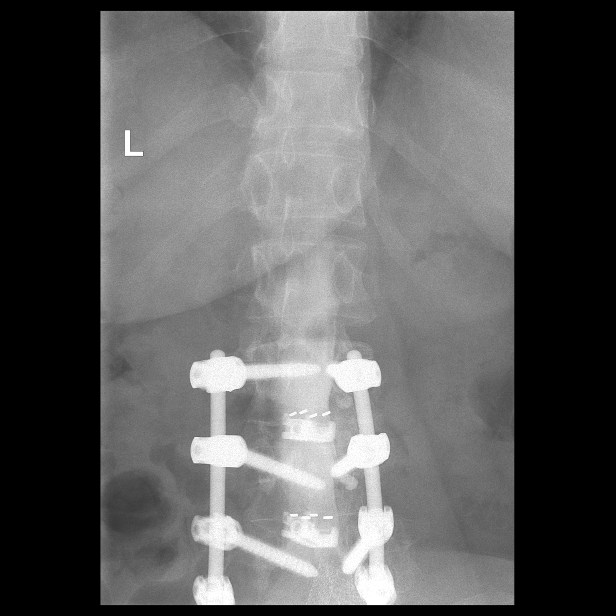

[Series 17: vasc adipose · 1 of 1 slices shown (9 of 11)]
[im 1/1]
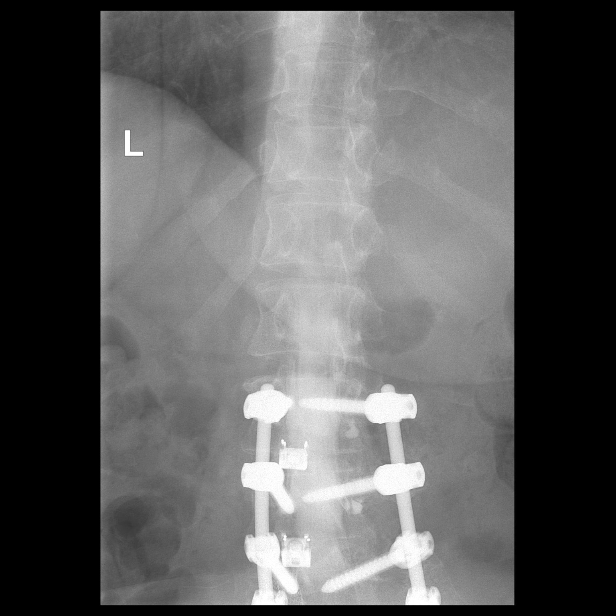

[Series 19: vasc adipose · 1 of 1 slices shown (10 of 11)]
[im 1/1]
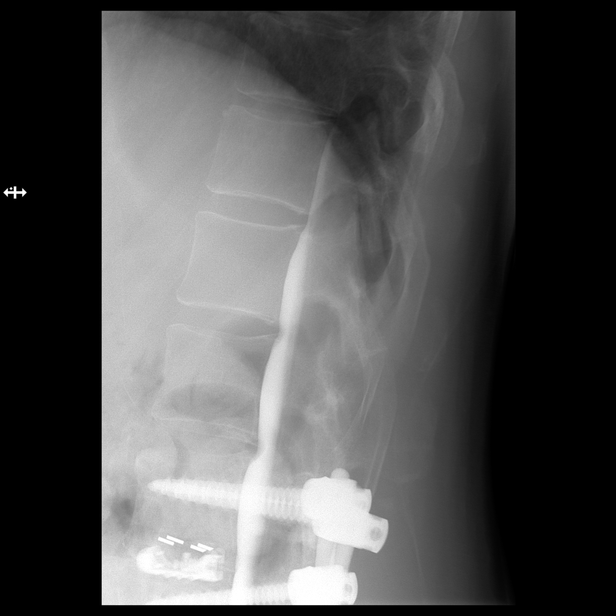

[Series 21: vasc adipose · 1 of 1 slices shown (11 of 11)]
[im 1/1]
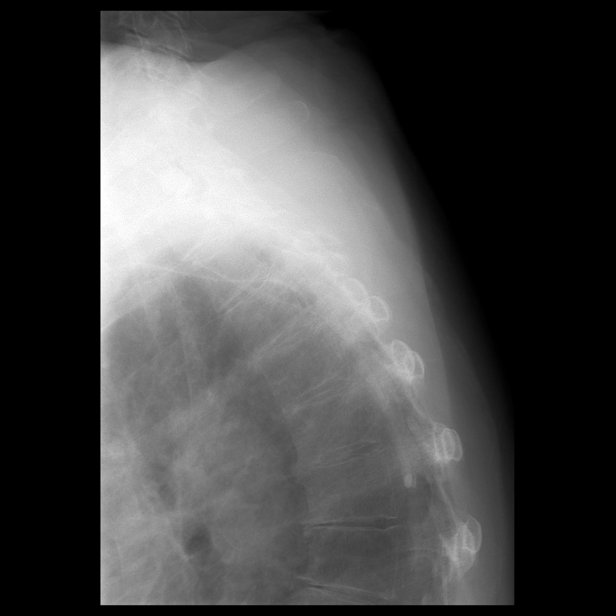

[13 of 24 positions shown; findings below may reference images not displayed]

FLUOROSCOPY TIME:  Radiation Exposure Index (as provided by the
fluoroscopic device): 359.03 uGy*m2

PROCEDURE:
LUMBAR PUNCTURE FOR THORACIC AND LUMBAR MYELOGRAM

After thorough discussion of risks and benefits of the procedure
including bleeding, infection, injury to nerves, blood vessels,
adjacent structures as well as headache and CSF leak, written and
oral informed consent was obtained. Consent was obtained by Dr.
Millanel Nob.

Patient was positioned prone on the fluoroscopy table. Local
anesthesia was provided with 1% lidocaine without epinephrine after
prepped and draped in the usual sterile fashion. Puncture was
performed at L1-2 using a 3 1/2 inch 22-gauge spinal needle via left
paramedian approach. Using a single pass through the dura, the
needle was placed within the thecal sac, with return of clear CSF.
10 mL of Isovue 2-8OO was injected into the thecal sac, with normal
opacification of the nerve roots and cauda equina consistent with
free flow within the subarachnoid space. The patient was then moved
to the trendelenburg position and contrast flowed into the Thoracic
spine region.

I personally performed the lumbar puncture and administered the
intrathecal contrast. I also personally supervised acquisition of
the myelogram images.
FINDINGS: THORACIC AND LUMBAR MYELOGRAM FINDINGS:

Lumbar fusion is noted L2-S1. Slight retrolisthesis is present at
L1-2, exaggerated by standing and extension. This is partially
reduced in flexion. Nerve root in the lower lumbar spine through the
fused segments fill normally. There is mild central canal narrowing
at the adjacent level, L1-2. No significant stenosis is evident in
the thoracic spine. Exaggerated kyphosis is noted.

CT THORACIC MYELOGRAM FINDINGS:

Vertebral body heights are maintained. There is slightly exaggerated
thoracic kyphosis. No acute or healing fractures are present. Vacuum
phenomenon are present anteriorly at T6-7, T7-8, T8-9, and T9-10.

Shallow left paramedian disc protrusion is present at T6-7 T8-9,
T9-10 without significant stenosis at these levels. Foramina are
patent bilaterally.

Paraspinous soft tissues are within normal limits. Mild dependent
changes are noted at the lung bases bilaterally. Focal nodule, mass,
or airspace disease is present. The heart is enlarged. Coronary
artery calcifications are present.

CT LUMBAR MYELOGRAM FINDINGS:

Five non rib-bearing lumbar type vertebral bodies are present.
Previous fusion has been extended. There is no solid fusion L2-S1.
Pedicle screw and rod fixation present bilaterally at each these
levels. Metallic disc spacer is now in place at L2-3 and at L3-4.

Atherosclerotic changes are noted in the aorta and branch vessels
without aneurysm. Distal aortic and bilateral iliac stents are in
place. No solid organ lesions are present.

L1-2: Mild rightward disc bulging is present without significant
stenosis. Facet hypertrophy and ligamentum flavum thickening is
noted as well.

L2-3: Solid fusion is present. Left laminectomy is present. No
residual recurrent stenosis is present.

L3-4: Solid fusion is present across the disc space. Left
laminectomy is noted. Nerve roots are carotid posteriorly on the
right. No residual recurrent stenosis is present.

L4-5: Solid fusion is present. Left laminectomy and foraminotomy are
present. No residual or recurrent stenosis is present.

L5-S1: Solid fusion is present. Wide laminectomy is noted. No
residual recurrent stenosis is present.
IMPRESSION: 1. Crowding of nerve roots on the right and posteriorly at L2-3 and
L3-4 raise the possibility of mild arachnoiditis.
2. Interval decompression of the central canal and foramina at L2-3
and L3-4.
3. Stable decompression of the L4-5 and L5-S1 levels.
4. Mild broad-based disc protrusion and bilateral facet hypertrophy
at L1-2 is worse with standing.
5. Mild dynamic retrolisthesis at L1-2 with standing and extension.
6. Mild degenerative changes in thoracic spine with exaggerated
kyphosis but no focal stenosis.
7.  Aortic Atherosclerosis ([id number]).
8. Cardiomegaly and coronary artery disease.

## 2021-10-21 DIAGNOSIS — G894 Chronic pain syndrome: Secondary | ICD-10-CM | POA: Diagnosis not present

## 2021-10-21 DIAGNOSIS — M5106 Intervertebral disc disorders with myelopathy, lumbar region: Secondary | ICD-10-CM | POA: Diagnosis not present

## 2021-11-20 DIAGNOSIS — M5106 Intervertebral disc disorders with myelopathy, lumbar region: Secondary | ICD-10-CM | POA: Diagnosis not present

## 2021-11-20 DIAGNOSIS — G894 Chronic pain syndrome: Secondary | ICD-10-CM | POA: Diagnosis not present

## 2021-12-22 DIAGNOSIS — M5106 Intervertebral disc disorders with myelopathy, lumbar region: Secondary | ICD-10-CM | POA: Diagnosis not present

## 2021-12-22 DIAGNOSIS — G894 Chronic pain syndrome: Secondary | ICD-10-CM | POA: Diagnosis not present

## 2022-01-19 DIAGNOSIS — G894 Chronic pain syndrome: Secondary | ICD-10-CM | POA: Diagnosis not present

## 2022-01-19 DIAGNOSIS — M5106 Intervertebral disc disorders with myelopathy, lumbar region: Secondary | ICD-10-CM | POA: Diagnosis not present

## 2022-02-17 DIAGNOSIS — M5106 Intervertebral disc disorders with myelopathy, lumbar region: Secondary | ICD-10-CM | POA: Diagnosis not present

## 2022-02-17 DIAGNOSIS — G894 Chronic pain syndrome: Secondary | ICD-10-CM | POA: Diagnosis not present

## 2022-03-19 DIAGNOSIS — G894 Chronic pain syndrome: Secondary | ICD-10-CM | POA: Diagnosis not present

## 2022-03-19 DIAGNOSIS — Z79899 Other long term (current) drug therapy: Secondary | ICD-10-CM | POA: Diagnosis not present

## 2022-03-19 DIAGNOSIS — Z79891 Long term (current) use of opiate analgesic: Secondary | ICD-10-CM | POA: Diagnosis not present

## 2022-03-19 DIAGNOSIS — M47892 Other spondylosis, cervical region: Secondary | ICD-10-CM | POA: Diagnosis not present

## 2022-03-19 DIAGNOSIS — M5106 Intervertebral disc disorders with myelopathy, lumbar region: Secondary | ICD-10-CM | POA: Diagnosis not present

## 2022-03-19 DIAGNOSIS — M791 Myalgia, unspecified site: Secondary | ICD-10-CM | POA: Diagnosis not present

## 2022-03-19 DIAGNOSIS — M542 Cervicalgia: Secondary | ICD-10-CM | POA: Diagnosis not present

## 2022-04-16 DIAGNOSIS — M542 Cervicalgia: Secondary | ICD-10-CM | POA: Diagnosis not present

## 2022-04-16 DIAGNOSIS — G894 Chronic pain syndrome: Secondary | ICD-10-CM | POA: Diagnosis not present

## 2022-05-13 DIAGNOSIS — M542 Cervicalgia: Secondary | ICD-10-CM | POA: Diagnosis not present

## 2022-05-13 DIAGNOSIS — M5106 Intervertebral disc disorders with myelopathy, lumbar region: Secondary | ICD-10-CM | POA: Diagnosis not present

## 2022-05-13 DIAGNOSIS — G894 Chronic pain syndrome: Secondary | ICD-10-CM | POA: Diagnosis not present

## 2022-06-21 DIAGNOSIS — G894 Chronic pain syndrome: Secondary | ICD-10-CM | POA: Diagnosis not present

## 2022-06-21 DIAGNOSIS — M791 Myalgia, unspecified site: Secondary | ICD-10-CM | POA: Diagnosis not present

## 2022-06-21 DIAGNOSIS — M542 Cervicalgia: Secondary | ICD-10-CM | POA: Diagnosis not present

## 2022-06-21 DIAGNOSIS — M5106 Intervertebral disc disorders with myelopathy, lumbar region: Secondary | ICD-10-CM | POA: Diagnosis not present

## 2022-07-19 DIAGNOSIS — M5106 Intervertebral disc disorders with myelopathy, lumbar region: Secondary | ICD-10-CM | POA: Diagnosis not present

## 2022-07-19 DIAGNOSIS — G894 Chronic pain syndrome: Secondary | ICD-10-CM | POA: Diagnosis not present

## 2022-07-27 DIAGNOSIS — E039 Hypothyroidism, unspecified: Secondary | ICD-10-CM | POA: Diagnosis not present

## 2022-07-27 DIAGNOSIS — E559 Vitamin D deficiency, unspecified: Secondary | ICD-10-CM | POA: Diagnosis not present

## 2022-07-27 DIAGNOSIS — I739 Peripheral vascular disease, unspecified: Secondary | ICD-10-CM | POA: Diagnosis not present

## 2022-07-27 DIAGNOSIS — E7800 Pure hypercholesterolemia, unspecified: Secondary | ICD-10-CM | POA: Diagnosis not present

## 2022-07-27 DIAGNOSIS — Z0000 Encounter for general adult medical examination without abnormal findings: Secondary | ICD-10-CM | POA: Diagnosis not present

## 2022-07-27 DIAGNOSIS — E538 Deficiency of other specified B group vitamins: Secondary | ICD-10-CM | POA: Diagnosis not present

## 2022-07-27 DIAGNOSIS — R739 Hyperglycemia, unspecified: Secondary | ICD-10-CM | POA: Diagnosis not present

## 2022-08-19 DIAGNOSIS — M5106 Intervertebral disc disorders with myelopathy, lumbar region: Secondary | ICD-10-CM | POA: Diagnosis not present

## 2022-08-19 DIAGNOSIS — G894 Chronic pain syndrome: Secondary | ICD-10-CM | POA: Diagnosis not present

## 2022-09-07 DIAGNOSIS — E039 Hypothyroidism, unspecified: Secondary | ICD-10-CM | POA: Diagnosis not present

## 2022-09-14 DIAGNOSIS — M5106 Intervertebral disc disorders with myelopathy, lumbar region: Secondary | ICD-10-CM | POA: Diagnosis not present

## 2022-09-14 DIAGNOSIS — G894 Chronic pain syndrome: Secondary | ICD-10-CM | POA: Diagnosis not present

## 2022-10-18 DIAGNOSIS — M791 Myalgia, unspecified site: Secondary | ICD-10-CM | POA: Diagnosis not present

## 2022-10-18 DIAGNOSIS — G894 Chronic pain syndrome: Secondary | ICD-10-CM | POA: Diagnosis not present

## 2022-10-18 DIAGNOSIS — Z79899 Other long term (current) drug therapy: Secondary | ICD-10-CM | POA: Diagnosis not present

## 2022-10-18 DIAGNOSIS — M542 Cervicalgia: Secondary | ICD-10-CM | POA: Diagnosis not present

## 2022-10-18 DIAGNOSIS — M5106 Intervertebral disc disorders with myelopathy, lumbar region: Secondary | ICD-10-CM | POA: Diagnosis not present

## 2022-11-16 DIAGNOSIS — M791 Myalgia, unspecified site: Secondary | ICD-10-CM | POA: Diagnosis not present

## 2022-11-16 DIAGNOSIS — M5106 Intervertebral disc disorders with myelopathy, lumbar region: Secondary | ICD-10-CM | POA: Diagnosis not present

## 2022-11-16 DIAGNOSIS — M542 Cervicalgia: Secondary | ICD-10-CM | POA: Diagnosis not present

## 2022-11-16 DIAGNOSIS — G894 Chronic pain syndrome: Secondary | ICD-10-CM | POA: Diagnosis not present

## 2022-12-17 DIAGNOSIS — M542 Cervicalgia: Secondary | ICD-10-CM | POA: Diagnosis not present

## 2022-12-17 DIAGNOSIS — M5106 Intervertebral disc disorders with myelopathy, lumbar region: Secondary | ICD-10-CM | POA: Diagnosis not present

## 2022-12-17 DIAGNOSIS — G894 Chronic pain syndrome: Secondary | ICD-10-CM | POA: Diagnosis not present

## 2023-01-12 DIAGNOSIS — G894 Chronic pain syndrome: Secondary | ICD-10-CM | POA: Diagnosis not present

## 2023-01-12 DIAGNOSIS — M5106 Intervertebral disc disorders with myelopathy, lumbar region: Secondary | ICD-10-CM | POA: Diagnosis not present

## 2023-01-12 DIAGNOSIS — M542 Cervicalgia: Secondary | ICD-10-CM | POA: Diagnosis not present

## 2023-02-16 DIAGNOSIS — M5106 Intervertebral disc disorders with myelopathy, lumbar region: Secondary | ICD-10-CM | POA: Diagnosis not present

## 2023-02-16 DIAGNOSIS — G894 Chronic pain syndrome: Secondary | ICD-10-CM | POA: Diagnosis not present

## 2023-02-16 DIAGNOSIS — M542 Cervicalgia: Secondary | ICD-10-CM | POA: Diagnosis not present

## 2023-03-14 DIAGNOSIS — G894 Chronic pain syndrome: Secondary | ICD-10-CM | POA: Diagnosis not present

## 2023-03-14 DIAGNOSIS — M5106 Intervertebral disc disorders with myelopathy, lumbar region: Secondary | ICD-10-CM | POA: Diagnosis not present

## 2023-03-14 DIAGNOSIS — M542 Cervicalgia: Secondary | ICD-10-CM | POA: Diagnosis not present

## 2023-04-15 DIAGNOSIS — Z79899 Other long term (current) drug therapy: Secondary | ICD-10-CM | POA: Diagnosis not present

## 2023-04-15 DIAGNOSIS — M791 Myalgia, unspecified site: Secondary | ICD-10-CM | POA: Diagnosis not present

## 2023-04-15 DIAGNOSIS — M5106 Intervertebral disc disorders with myelopathy, lumbar region: Secondary | ICD-10-CM | POA: Diagnosis not present

## 2023-04-15 DIAGNOSIS — M542 Cervicalgia: Secondary | ICD-10-CM | POA: Diagnosis not present

## 2023-04-15 DIAGNOSIS — G894 Chronic pain syndrome: Secondary | ICD-10-CM | POA: Diagnosis not present

## 2023-04-15 DIAGNOSIS — Z79891 Long term (current) use of opiate analgesic: Secondary | ICD-10-CM | POA: Diagnosis not present

## 2023-05-16 DIAGNOSIS — M542 Cervicalgia: Secondary | ICD-10-CM | POA: Diagnosis not present

## 2023-05-16 DIAGNOSIS — G894 Chronic pain syndrome: Secondary | ICD-10-CM | POA: Diagnosis not present

## 2023-05-16 DIAGNOSIS — M5106 Intervertebral disc disorders with myelopathy, lumbar region: Secondary | ICD-10-CM | POA: Diagnosis not present

## 2023-05-16 DIAGNOSIS — M791 Myalgia, unspecified site: Secondary | ICD-10-CM | POA: Diagnosis not present

## 2023-06-08 DIAGNOSIS — M5106 Intervertebral disc disorders with myelopathy, lumbar region: Secondary | ICD-10-CM | POA: Diagnosis not present

## 2023-06-08 DIAGNOSIS — G894 Chronic pain syndrome: Secondary | ICD-10-CM | POA: Diagnosis not present

## 2023-07-13 DIAGNOSIS — G894 Chronic pain syndrome: Secondary | ICD-10-CM | POA: Diagnosis not present

## 2023-07-13 DIAGNOSIS — M5106 Intervertebral disc disorders with myelopathy, lumbar region: Secondary | ICD-10-CM | POA: Diagnosis not present

## 2023-08-10 DIAGNOSIS — M4727 Other spondylosis with radiculopathy, lumbosacral region: Secondary | ICD-10-CM | POA: Diagnosis not present

## 2023-08-10 DIAGNOSIS — G894 Chronic pain syndrome: Secondary | ICD-10-CM | POA: Diagnosis not present

## 2023-08-10 DIAGNOSIS — M5442 Lumbago with sciatica, left side: Secondary | ICD-10-CM | POA: Diagnosis not present

## 2023-08-10 DIAGNOSIS — M5441 Lumbago with sciatica, right side: Secondary | ICD-10-CM | POA: Diagnosis not present

## 2023-09-05 DIAGNOSIS — G894 Chronic pain syndrome: Secondary | ICD-10-CM | POA: Diagnosis not present

## 2023-09-05 DIAGNOSIS — M5442 Lumbago with sciatica, left side: Secondary | ICD-10-CM | POA: Diagnosis not present

## 2023-09-05 DIAGNOSIS — M47812 Spondylosis without myelopathy or radiculopathy, cervical region: Secondary | ICD-10-CM | POA: Diagnosis not present

## 2023-09-05 DIAGNOSIS — M542 Cervicalgia: Secondary | ICD-10-CM | POA: Diagnosis not present

## 2023-09-20 DIAGNOSIS — I10 Essential (primary) hypertension: Secondary | ICD-10-CM | POA: Diagnosis not present

## 2023-09-20 DIAGNOSIS — E538 Deficiency of other specified B group vitamins: Secondary | ICD-10-CM | POA: Diagnosis not present

## 2023-09-20 DIAGNOSIS — E039 Hypothyroidism, unspecified: Secondary | ICD-10-CM | POA: Diagnosis not present

## 2023-09-20 DIAGNOSIS — Z0000 Encounter for general adult medical examination without abnormal findings: Secondary | ICD-10-CM | POA: Diagnosis not present

## 2023-09-20 DIAGNOSIS — E559 Vitamin D deficiency, unspecified: Secondary | ICD-10-CM | POA: Diagnosis not present

## 2023-09-20 DIAGNOSIS — E7800 Pure hypercholesterolemia, unspecified: Secondary | ICD-10-CM | POA: Diagnosis not present

## 2023-09-24 NOTE — Progress Notes (Addendum)
 Assessment/Plan:   Lisa [name] is a very pleasant 68 y.o. year old RH female with a history of hypertension, hyperlipidemia, hypothyroidism, anemia, CAD, PVD, PAD, anxiety, chronic low back pain, vitamin D deficiency insomnia seen today for evaluation of memory loss. MoCA today is 30/30 .  Etiology is unclear at this time, suspect multifactorial, including a combination of high anxiety, depression and polypharmacy.    Patient is on Ambien, oxycodone, gabapentin, and Flexeril, along with Xanax, which in combination, could contribute to some of her memory changes, she has been instructed to discuss with her physician the need to be on these type of polypharmacy.  She is able to participate in her ADLs and to drive without major difficulties.   Memory Impairment of unclear etiology  MRI brain without contrast to assess for underlying structural abnormality and assess vascular load  Neurocognitive testing to further evaluate cognitive concerns and determine other underlying cause of memory changes, including potential contribution from sleep, anxiety, attention, or depression  Follow-up the pain clinic, consider discussing with the prescribing physician for reduction of the medications muscle relaxers, as well as discussed with PCP discontinuing Ambien, and Xanax,recommend psychotherapy and other relaxation techniques. Continue vitamin D replenishment and B12 injections Discussed the role of psychotherapy to help with anxiety and depression, situational stress Recommend good control of cardiovascular risk factors, continue Plavix  Folllow up pending on the results of the neuropsych evaluation  Subjective:   The patient is here alone.   How long did patient have memory difficulties? For the last year, when she could not remember what she did the prior day.  Reports some difficulty with short-term memory, remembering new information, content of a chapter in a book,  conversations and names of  her grandchildren who live with her My writing does not make sense, I am misspelling. Long-term memory is good. repeats oneself?  Endorsed Disoriented when walking into a room?  Endorsed, occasionally not remembering what patient came to the room for.  Sometimes I go to a room and then I don't come out of it and I have that my husband that if I take too long to come and get me .  Leaving objects in unusual places? Yes, she has left her glasses on the refrigerator.  I was instructed on  Wandering behavior?  denies .  Any personality changes?  She notes to have been more anxious lately.  Stress affects her daily life, she is caring for her husband who has cancer. Any history of depression?:  She has a history of anxiety and depression. She never sought psychotherapy.  Hallucinations or paranoia?  Denies   Seizures?  Denies    Any sleep changes?   Sleeps well  with Xanax and Ambien. Denies vivid dreams, REM behavior or sleepwalking   Sleep apnea?  Denies   Any hygiene concerns?  Denies   Independent of bathing and dressing?  Endorsed  Does the patient needs help with medications? Patient is in charge, she may miss some doses, has a pillbox now to make sure that she took the pills..  Who is in charge of the finances? Patient is in charge, has to leave notes to remind herself of the payments     Any changes in appetite?  Denies  I am never too hungry   Patient have trouble swallowing? Denies.   Does the patient cook?  Yes, she denies forgetting any common recipes.   Any kitchen accidents such as leaving the stove on?  Denies.   Any history of headaches?   Denies.   Chronic pain ?  Endorsed, I had 4 back surgeries attends pain clinic which she receives lumbar injections Ambulates with difficulty?  Some days I need a cane. Sometimes I walk fine and sometimes I don't, like my legs do not exist but that has been going on for decades   Recent falls or head injuries? Denies.   Vision changes?  Denies.   Any stroke like symptoms? Denies.   Any tremors?  She has a history of familial ET, minimal. Any anosmia?  Denies.   Any incontinence of urine? Denies.   Any bowel dysfunction? Denies.      Patient lives with her husband,  son  and grandchildren  History of heavy alcohol intake?  Never History of heavy tobacco use? Denies.   Family history of dementia? her 2 aunts ? Type  Does patient drive?  No longer drives as she is afraid of it.    Past Medical History:  Diagnosis Date   Anemia    CAD (coronary artery disease)    Carotid artery occlusion    inferior stemi   Hypertension    MI (myocardial infarction) (HCC)    Overweight(278.02)    PVD (peripheral vascular disease) (HCC)    Thyroid disease    hypothyroid   Tobacco user      Past Surgical History:  Procedure Laterality Date   ABDOMINAL HYSTERECTOMY     APPENDECTOMY     BACK SURGERY     CORONARY ANGIOPLASTY WITH STENT PLACEMENT     PLANTAR FASCIA SURGERY       Allergies  Allergen Reactions   Prednisone Rash and Other (See Comments)    Sores in mouth   Tape Other (See Comments)    Causes blisters.  States use paper tape.   Codeine Rash and Other (See Comments)    tears up stomach    Crestor  [Rosuvastatin  Calcium ] Other (See Comments)    Myalgias     Current Outpatient Medications  Medication Instructions   ALPRAZolam (XANAX) 0.5 mg, 2 times daily   aspirin  EC 81 mg, Oral, Daily   atenolol (TENORMIN) 50 mg, 2 times daily   clopidogrel  (PLAVIX ) 75 mg, Daily   Cyanocobalamin (VITAMIN B-12 IJ) Every 30 days   cyclobenzaprine (FLEXERIL) 10 mg, 3 times daily   gabapentin (NEURONTIN) 300 mg, 2 times daily   levothyroxine (SYNTHROID) 88 mcg, Daily before breakfast   lisinopril (ZESTRIL) 20 mg, Daily   nitroGLYCERIN  (NITROSTAT ) 0.4 mg, Sublingual, Every 5 min PRN   oxyCODONE-acetaminophen (PERCOCET) 10-325 MG tablet 1 tablet, Every 4 hours PRN   zolpidem (AMBIEN) 5 mg, Daily at bedtime     VITALS:    Vitals:   09/29/23 1326  BP: 122/78  Pulse: 78  Resp: 20  SpO2: 97%  Weight: 134 lb (60.8 kg)  Height: 5' 4 (1.626 m)         09/29/2023    5:00 PM  Montreal Cognitive Assessment   Visuospatial/ Executive (0/5) 5  Naming (0/3) 3  Attention: Read list of digits (0/2) 2  Attention: Read list of letters (0/1) 1  Attention: Serial 7 subtraction starting at 100 (0/3) 3  Language: Repeat phrase (0/2) 2  Language : Fluency (0/1) 1  Abstraction (0/2) 2  Delayed Recall (0/5) 5  Orientation (0/6) 6  Total 30  Adjusted Score (based on education) 30        No data to display  PHYSICAL EXAM   HEENT:  Normocephalic, atraumatic. The superficial temporal arteries are without ropiness or tenderness. Cardiovascular: Regular rate and rhythm. Lungs: Clear to auscultation bilaterally. Neck: There are no carotid bruits noted bilaterally.  Orientation:  Alert and oriented to person, place and time. No aphasia or dysarthria. Fund of knowledge is appropriate. Recent and remote memory intact.  Attention and concentration are normal.  Able to name objects and repeat phrases. Delayed recall 5/5 Cranial nerves: There is good facial symmetry.  Anxious appearing.  Extraocular muscles are intact and visual fields are full to confrontational testing. Speech is fluent and clear. No tongue deviation. Hearing is intact to conversational tone. Tone: Tone is good throughout. Abnormal movements: No tremors noted today. No Asterixis. No Fasciculations Sensation: Sensation is intact to light touch. Vibration is intact at the bilateral big toe.  Coordination: The patient has no difficulty with RAM's or FNF bilaterally. Normal finger to nose  Motor: Strength is 5/5 in the bilateral upper and lower extremities. There is no pronator drift. There are no fasciculations noted. DTR's: Deep tendon reflexes are 2/4 bilaterally. Gait and Station: The patient is able to ambulate with some difficulty.  The  patient is unable to heel toe walk. Gait is cautious and narrow. The patient is able to ambulate in a tandem fashion.       Thank you for allowing us  the opportunity to participate in the care of this nice patient. Please do not hesitate to contact us  for any questions or concerns.   Total time spent on today's visit was 50 minutes dedicated to this patient today, preparing to see patient, examining the patient, ordering tests and/or medications and counseling the patient, documenting clinical information in the EHR or other health record, independently interpreting results and communicating results to the patient/family, discussing treatment and goals, answering patient's questions and coordinating care.  Cc:  Debrah Josette ORN., PA-C  Camie Sevin 09/29/2023 5:27 PM

## 2023-09-29 ENCOUNTER — Ambulatory Visit: Payer: Self-pay | Admitting: Physician Assistant

## 2023-09-29 ENCOUNTER — Encounter

## 2023-09-29 ENCOUNTER — Encounter: Payer: Self-pay | Admitting: Physician Assistant

## 2023-09-29 VITALS — BP 122/78 | HR 78 | Resp 20 | Ht 64.0 in | Wt 134.0 lb

## 2023-09-29 DIAGNOSIS — R413 Other amnesia: Secondary | ICD-10-CM | POA: Insufficient documentation

## 2023-09-29 NOTE — Patient Instructions (Signed)
 MRI brain  Neuropsych evaluation  Follow up pending on the results of the neuropsych

## 2023-10-05 DIAGNOSIS — M47812 Spondylosis without myelopathy or radiculopathy, cervical region: Secondary | ICD-10-CM | POA: Diagnosis not present

## 2023-10-05 DIAGNOSIS — M5442 Lumbago with sciatica, left side: Secondary | ICD-10-CM | POA: Diagnosis not present

## 2023-10-05 DIAGNOSIS — G894 Chronic pain syndrome: Secondary | ICD-10-CM | POA: Diagnosis not present

## 2023-10-05 DIAGNOSIS — M542 Cervicalgia: Secondary | ICD-10-CM | POA: Diagnosis not present

## 2023-10-07 ENCOUNTER — Encounter: Payer: Self-pay | Admitting: Physician Assistant

## 2023-10-20 ENCOUNTER — Ambulatory Visit: Payer: Self-pay | Admitting: Neurology

## 2023-10-20 ENCOUNTER — Ambulatory Visit
Admission: RE | Admit: 2023-10-20 | Discharge: 2023-10-20 | Disposition: A | Discharge status: Home / Self Care | Source: Ambulatory Visit | Attending: Physician Assistant | Admitting: Physician Assistant

## 2023-10-20 DIAGNOSIS — R410 Disorientation, unspecified: Secondary | ICD-10-CM | POA: Diagnosis not present

## 2023-10-20 NOTE — Progress Notes (Signed)
 Left message to call office at 2:45pm 10/20/2023

## 2023-11-11 DIAGNOSIS — M4727 Other spondylosis with radiculopathy, lumbosacral region: Secondary | ICD-10-CM | POA: Diagnosis not present

## 2023-11-11 DIAGNOSIS — M48062 Spinal stenosis, lumbar region with neurogenic claudication: Secondary | ICD-10-CM | POA: Diagnosis not present

## 2023-11-11 DIAGNOSIS — M5442 Lumbago with sciatica, left side: Secondary | ICD-10-CM | POA: Diagnosis not present

## 2023-11-11 DIAGNOSIS — G894 Chronic pain syndrome: Secondary | ICD-10-CM | POA: Diagnosis not present

## 2023-11-11 DIAGNOSIS — M542 Cervicalgia: Secondary | ICD-10-CM | POA: Diagnosis not present

## 2023-11-11 DIAGNOSIS — Z1330 Encounter for screening examination for mental health and behavioral disorders, unspecified: Secondary | ICD-10-CM | POA: Diagnosis not present

## 2023-11-11 DIAGNOSIS — M791 Myalgia, unspecified site: Secondary | ICD-10-CM | POA: Diagnosis not present

## 2023-12-09 DIAGNOSIS — M48062 Spinal stenosis, lumbar region with neurogenic claudication: Secondary | ICD-10-CM | POA: Diagnosis not present

## 2023-12-09 DIAGNOSIS — G894 Chronic pain syndrome: Secondary | ICD-10-CM | POA: Diagnosis not present

## 2023-12-09 DIAGNOSIS — M4727 Other spondylosis with radiculopathy, lumbosacral region: Secondary | ICD-10-CM | POA: Diagnosis not present

## 2023-12-09 DIAGNOSIS — M5442 Lumbago with sciatica, left side: Secondary | ICD-10-CM | POA: Diagnosis not present

## 2024-01-11 DIAGNOSIS — M5442 Lumbago with sciatica, left side: Secondary | ICD-10-CM | POA: Diagnosis not present

## 2024-01-11 DIAGNOSIS — M4727 Other spondylosis with radiculopathy, lumbosacral region: Secondary | ICD-10-CM | POA: Diagnosis not present

## 2024-01-11 DIAGNOSIS — G894 Chronic pain syndrome: Secondary | ICD-10-CM | POA: Diagnosis not present

## 2024-01-11 DIAGNOSIS — M48062 Spinal stenosis, lumbar region with neurogenic claudication: Secondary | ICD-10-CM | POA: Diagnosis not present

## 2024-01-25 ENCOUNTER — Ambulatory Visit: Payer: Self-pay

## 2024-01-25 ENCOUNTER — Institutional Professional Consult (permissible substitution): Admitting: Psychology

## 2024-01-30 DIAGNOSIS — K080 Exfoliation of teeth due to systemic causes: Secondary | ICD-10-CM | POA: Diagnosis not present

## 2024-02-01 ENCOUNTER — Encounter: Admitting: Psychology

## 2024-02-08 DIAGNOSIS — M4727 Other spondylosis with radiculopathy, lumbosacral region: Secondary | ICD-10-CM | POA: Diagnosis not present

## 2024-02-08 DIAGNOSIS — M542 Cervicalgia: Secondary | ICD-10-CM | POA: Diagnosis not present

## 2024-02-08 DIAGNOSIS — M47812 Spondylosis without myelopathy or radiculopathy, cervical region: Secondary | ICD-10-CM | POA: Diagnosis not present

## 2024-02-08 DIAGNOSIS — G894 Chronic pain syndrome: Secondary | ICD-10-CM | POA: Diagnosis not present

## 2024-03-28 ENCOUNTER — Ambulatory Visit: Admitting: Physician Assistant
